# Patient Record
Sex: Male | Born: 1965 | Race: White | Hispanic: No | Marital: Married | Smoking: Never smoker
Health system: Southern US, Community
[De-identification: ages and names within clinical notes are randomized; demographics above are authoritative.]

## PROBLEM LIST (undated history)

## (undated) DIAGNOSIS — T7840XA Allergy, unspecified, initial encounter: Secondary | ICD-10-CM

## (undated) DIAGNOSIS — K219 Gastro-esophageal reflux disease without esophagitis: Secondary | ICD-10-CM

## (undated) HISTORY — DX: Allergy, unspecified, initial encounter: T78.40XA

## (undated) HISTORY — DX: Gastro-esophageal reflux disease without esophagitis: K21.9

---
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
## 2000-02-17 ENCOUNTER — Emergency Department (HOSPITAL_COMMUNITY): Admission: EM | Admit: 2000-02-17 | Discharge: 2000-02-17 | Payer: Self-pay | Admitting: Emergency Medicine

## 2000-02-25 ENCOUNTER — Emergency Department (HOSPITAL_COMMUNITY): Admission: EM | Admit: 2000-02-25 | Discharge: 2000-02-26 | Payer: Self-pay | Admitting: Emergency Medicine

## 2004-01-21 ENCOUNTER — Ambulatory Visit: Payer: Self-pay | Admitting: Internal Medicine

## 2004-03-23 ENCOUNTER — Ambulatory Visit: Payer: Self-pay

## 2005-04-29 ENCOUNTER — Ambulatory Visit: Payer: Self-pay | Admitting: Internal Medicine

## 2005-04-29 ENCOUNTER — Inpatient Hospital Stay (HOSPITAL_COMMUNITY): Admission: EM | Admit: 2005-04-29 | Discharge: 2005-04-30 | Payer: Self-pay | Admitting: Emergency Medicine

## 2005-05-05 ENCOUNTER — Ambulatory Visit: Payer: Self-pay | Admitting: Internal Medicine

## 2005-05-09 ENCOUNTER — Ambulatory Visit: Payer: Self-pay | Admitting: Cardiovascular Disease

## 2005-05-25 ENCOUNTER — Ambulatory Visit: Payer: Self-pay | Admitting: Internal Medicine

## 2005-06-14 ENCOUNTER — Ambulatory Visit: Payer: Self-pay | Admitting: Internal Medicine

## 2005-07-13 ENCOUNTER — Ambulatory Visit: Payer: Self-pay | Admitting: Internal Medicine

## 2005-07-26 ENCOUNTER — Ambulatory Visit: Payer: Self-pay | Admitting: Cardiovascular Disease

## 2005-08-02 ENCOUNTER — Ambulatory Visit: Payer: Self-pay | Admitting: Gastroenterology

## 2005-08-04 ENCOUNTER — Ambulatory Visit: Payer: Self-pay | Admitting: Internal Medicine

## 2005-08-19 ENCOUNTER — Ambulatory Visit: Payer: Self-pay

## 2005-08-23 ENCOUNTER — Ambulatory Visit: Payer: Self-pay | Admitting: Gastroenterology

## 2005-12-26 ENCOUNTER — Ambulatory Visit: Payer: Self-pay | Admitting: Internal Medicine

## 2005-12-27 ENCOUNTER — Ambulatory Visit: Payer: Self-pay | Admitting: Internal Medicine

## 2005-12-27 ENCOUNTER — Ambulatory Visit: Payer: Self-pay | Admitting: Cardiovascular Disease

## 2006-01-23 ENCOUNTER — Ambulatory Visit: Payer: Self-pay | Admitting: Internal Medicine

## 2006-12-26 DIAGNOSIS — K219 Gastro-esophageal reflux disease without esophagitis: Secondary | ICD-10-CM | POA: Insufficient documentation

## 2007-09-25 ENCOUNTER — Ambulatory Visit: Payer: Self-pay | Admitting: Internal Medicine

## 2007-09-28 ENCOUNTER — Ambulatory Visit: Payer: Self-pay | Admitting: Internal Medicine

## 2007-09-28 LAB — CONVERTED CEMR LAB
Albumin: 3.9 g/dL (ref 3.5–5.2)
Alkaline Phosphatase: 54 units/L (ref 39–117)
BUN: 15 mg/dL (ref 6–23)
Basophils Relative: 0.6 % (ref 0.0–1.0)
Bilirubin Urine: NEGATIVE
Bilirubin Urine: NEGATIVE
Calcium: 9.4 mg/dL (ref 8.4–10.5)
Creatinine, Ser: 0.9 mg/dL (ref 0.4–1.5)
Direct LDL: 117.5 mg/dL
Eosinophils Absolute: 0.2 10*3/uL (ref 0.0–0.7)
Eosinophils Relative: 3.3 % (ref 0.0–5.0)
GFR calc Af Amer: 120 mL/min
GFR calc non Af Amer: 99 mL/min
Glucose, Bld: 99 mg/dL (ref 70–99)
Glucose, Urine, Semiquant: NEGATIVE
HCT: 45.9 % (ref 39.0–52.0)
Hemoglobin: 15.9 g/dL (ref 13.0–17.0)
Ketones, urine, test strip: NEGATIVE
MCV: 89.3 fL (ref 78.0–100.0)
Monocytes Absolute: 0.6 10*3/uL (ref 0.1–1.0)
Monocytes Relative: 9.4 % (ref 3.0–12.0)
Neutro Abs: 3.9 10*3/uL (ref 1.4–7.7)
Nitrite: NEGATIVE
Platelets: 246 10*3/uL (ref 150–400)
Protein, U semiquant: NEGATIVE
Specific Gravity, Urine: 1.02
TSH: 1.06 microintl units/mL (ref 0.35–5.50)
Total CHOL/HDL Ratio: 6.7
Total Protein: 8 g/dL (ref 6.0–8.3)
Triglycerides: 207 mg/dL (ref 0–149)
Urobilinogen, UA: 0.2
WBC Urine, dipstick: NEGATIVE
WBC: 6.6 10*3/uL (ref 4.5–10.5)
pH: 5

## 2008-05-30 ENCOUNTER — Ambulatory Visit: Payer: Self-pay | Admitting: Internal Medicine

## 2008-05-30 DIAGNOSIS — J069 Acute upper respiratory infection, unspecified: Secondary | ICD-10-CM | POA: Insufficient documentation

## 2008-07-31 ENCOUNTER — Ambulatory Visit: Payer: Self-pay | Admitting: Internal Medicine

## 2008-07-31 DIAGNOSIS — J309 Allergic rhinitis, unspecified: Secondary | ICD-10-CM | POA: Insufficient documentation

## 2008-10-02 ENCOUNTER — Ambulatory Visit: Payer: Self-pay | Admitting: Internal Medicine

## 2008-10-02 DIAGNOSIS — R1031 Right lower quadrant pain: Secondary | ICD-10-CM | POA: Insufficient documentation

## 2008-10-22 ENCOUNTER — Telehealth: Payer: Self-pay | Admitting: Internal Medicine

## 2008-10-24 ENCOUNTER — Ambulatory Visit: Payer: Self-pay | Admitting: Internal Medicine

## 2008-10-24 ENCOUNTER — Encounter: Admission: RE | Admit: 2008-10-24 | Discharge: 2008-10-24 | Payer: Self-pay | Admitting: Internal Medicine

## 2008-10-24 DIAGNOSIS — Z87448 Personal history of other diseases of urinary system: Secondary | ICD-10-CM | POA: Insufficient documentation

## 2008-10-24 LAB — CONVERTED CEMR LAB
Nitrite: NEGATIVE
Specific Gravity, Urine: 1.025
WBC Urine, dipstick: NEGATIVE

## 2009-06-16 ENCOUNTER — Telehealth: Payer: Self-pay | Admitting: Internal Medicine

## 2009-06-16 ENCOUNTER — Encounter: Payer: Self-pay | Admitting: Internal Medicine

## 2009-08-25 ENCOUNTER — Telehealth: Payer: Self-pay | Admitting: Internal Medicine

## 2009-08-27 ENCOUNTER — Ambulatory Visit: Payer: Self-pay | Admitting: Internal Medicine

## 2009-08-27 DIAGNOSIS — J029 Acute pharyngitis, unspecified: Secondary | ICD-10-CM | POA: Insufficient documentation

## 2010-04-20 NOTE — Assessment & Plan Note (Signed)
Summary: sore throat/dm  And a the  Vital Signs:  Patient profile:   45 year old male Weight:      178 pounds Temp:     98.7 degrees F oral BP sitting:   110 / 74  (right arm) Cuff size:   regular  Vitals Entered By: Duard Brady LPN (August 28, 5174 3:17 PM) CC: c/o sore throat , bodyaches, chills        no n/v/d Is Patient Diabetic? No   Primary Care Provider:  Gordy Savers  MD  CC:  c/o sore throat , bodyaches, and chills        no n/v/d.  History of Present Illness: 45 year old patient with a 4-day history of sore throat, headache, body aches, intermittent chills.  A rapid strep positive.  Today.  No obvious strep exposure.  Preventive Screening-Counseling & Management  Alcohol-Tobacco     Smoking Status: never  Allergies: 1)  ! Claritin  Past History:  Past Medical History: Reviewed history from 07/31/2008 and no changes required. Sinusitis GERD IBS CP Allergic rhinitis  Review of Systems       The patient complains of anorexia, fever, muscle weakness, and headaches.    Physical Exam  General:  Well-developed,well-nourished,in no acute distress; alert,appropriate and cooperative throughout examination Head:  Normocephalic and atraumatic without obvious abnormalities. No apparent alopecia or balding. Eyes:  No corneal or conjunctival inflammation noted. EOMI. Perrla. Funduscopic exam benign, without hemorrhages, exudates or papilledema. Vision grossly normal. Mouth:  pharyngeal erythema.  pharyngeal erythema.  pharyngeal erythema.   Neck:  No deformities, masses, or tenderness noted. Lungs:  Normal respiratory effort, chest expands symmetrically. Lungs are clear to auscultation, no crackles or wheezes. Heart:  Normal rate and regular rhythm. S1 and S2 normal without gallop, murmur, click, rub or other extra sounds.   Impression & Recommendations:  Problem # 1:  SORE THROAT (ICD-462)  His updated medication list for this problem includes:  Amoxicillin 500 Mg Caps (Amoxicillin) ..... One capsule 3 times daily with meals  Orders: Rapid Strep (16073)  His updated medication list for this problem includes:    Amoxicillin 500 Mg Caps (Amoxicillin) ..... One capsule 3 times daily with meals  Problem # 2:  GERD (ICD-530.81)  Complete Medication List: 1)  Amoxicillin 500 Mg Caps (Amoxicillin) .... One capsule 3 times daily with meals  Patient Instructions: 1)  Take your antibiotic as prescribed until ALL of it is gone, but stop if you develop a rash or swelling and contact our office as soon as possible. 2)  CELEBREX  2 DAILY  Prescriptions: AMOXICILLIN 500 MG CAPS (AMOXICILLIN) one capsule 3 times daily with meals  #30 x 0   Entered and Authorized by:   Gordy Savers  MD   Signed by:   Gordy Savers  MD on 08/27/2009   Method used:   Print then Give to Patient   RxID:   [id number] AMOXICILLIN 500 MG CAPS (AMOXICILLIN) one capsule 3 times daily with meals  #30 x 0   Entered and Authorized by:   Gordy Savers  MD   Signed by:   Gordy Savers  MD on 08/27/2009   Method used:   Print then Give to Patient   RxID:   [id number] AMOXICILLIN 500 MG CAPS (AMOXICILLIN) one capsule 3 times daily with meals  #30 x 0   Entered and Authorized by:   Gordy Savers  MD   Signed by:  Gordy Savers  MD on 08/27/2009   Method used:   Electronically to        [facility] # [id number]* (retail)       [address]       [place], [place]  [address]       Ph: [phone]       Fax: [phone]   RxID:   [id number]   Laboratory Results    Other Tests  Rapid Strep: positive

## 2010-04-20 NOTE — Progress Notes (Signed)
Summary: REQ FOR DOCUMENTATION  Phone Note Call from Patient   Caller: Patient [phone] Reason for Call: Talk to Nurse, Talk to Doctor Summary of Call: Pt called to adv that he is going out of the country ([place]) and will need documentation stating that he doesn't have and has not had any types of diseases such as malaria, etc....Marland KitchenMarland KitchenPt adv that he has been a pt of Dr K for over 15-20 yrs and all of his records should be at [facility]..... Pt needs same documented on [facility] letterhead.... Pt adv that he can be reached at [phone] with any questions or concerns....Marland Kitchen or when same is ready for p/u.   Initial call taken by: Debbra Riding,  June 16, 2009 10:19 AM  Follow-up for Phone Call        Called pt and adv him that letter has been done and is ready for p/u.  Follow-up by: Debbra Riding,  June 16, 2009 11:43 AM    done-OK to print on letterhead  Appended Document: REQ FOR DOCUMENTATION attempt to call number left by pt - ans mach - LMTCB if questions - letter ready for pick up. KIK

## 2010-04-20 NOTE — Letter (Signed)
Summary: Generic Letter  [place] [facility]  [address] [place], [place] [address]   Phone: [phone]  Fax: [phone]    06/16/2009  [name] [address] [place], [place]  [address]  Dear [name]:  Mr. [name] is a patient followed in our internal medicine practice for approximately 15 years.  He takes no chronic medication and has no significant health issues.  He has no history of malaria or any communicable disease.  He has no activity restrictions and remains in excellent health. If further details are required please do not hesitate to contact this office.        Sincerely,   Eleonore Chiquito  MD

## 2010-04-20 NOTE — Progress Notes (Signed)
Summary: viral?  Phone Note Call from Patient   Summary of Call: Woke aching legs & headache & all over.  Feels hot, ?fever.  Nausea & stomach hurts.  Flu or virus.  Clear liquids, sips only until better tolerated with nausea, 24 hours.  Rest.  Prilosec or Pepcid AC is available OTC as needed.  Also asking about inhaling from chicken box.  Would usually cause respiratory symptoms, but no way to say for sure.   OV declined at this time.  Will call back as needed.   Initial call taken by: Rudy Jew, RN,  August 25, 2009 12:08 PM

## 2010-06-16 ENCOUNTER — Ambulatory Visit (INDEPENDENT_AMBULATORY_CARE_PROVIDER_SITE_OTHER): Payer: Self-pay | Admitting: Family Medicine

## 2010-06-16 ENCOUNTER — Encounter: Payer: Self-pay | Admitting: Family Medicine

## 2010-06-16 VITALS — BP 120/80 | Temp 97.8°F | Ht 67.5 in | Wt 178.0 lb

## 2010-06-16 DIAGNOSIS — J302 Other seasonal allergic rhinitis: Secondary | ICD-10-CM

## 2010-06-16 DIAGNOSIS — J309 Allergic rhinitis, unspecified: Secondary | ICD-10-CM

## 2010-06-16 NOTE — Progress Notes (Signed)
  Subjective:    Patient ID: [name], male    DOB: Jul 27, 1965, 45 y.o.   MRN: [id number]  HPI Patient seen with 2 week history of intermittent symptoms including frequent sneezing, watery itchy eyes, intermittent dry cough, and frequent clearing of throat. No fever. No dyspnea. Has taken over-the-counter Sudafed without much relief. Has not tried any antihistamines recently. Previous intolerance to Claritin. Patient is a nonsmoker. No chronic respiratory problems.   Review of Systems  Constitutional: Negative for fever and chills.  HENT: Positive for congestion, rhinorrhea, sneezing and postnasal drip. Negative for ear pain and sore throat.   Eyes: Negative for visual disturbance.  Respiratory: Positive for cough. Negative for shortness of breath.   Cardiovascular: Negative for chest pain.  Skin: Negative for rash.       Objective:   Physical Exam  Constitutional: He appears well-developed and well-nourished.  HENT:  Mouth/Throat: No oropharyngeal exudate.  Eyes: Conjunctivae are normal. Pupils are equal, round, and reactive to light. Right eye exhibits no discharge. Left eye exhibits no discharge.  Neck: Neck supple.  Cardiovascular: Normal rate, regular rhythm and normal heart sounds.   No murmur heard. Pulmonary/Chest: Effort normal and breath sounds normal. He has no wheezes. He has no rales.  Lymphadenopathy:    He has no cervical adenopathy.          Assessment & Plan:  Allergic rhinitis, seasonal. Start Allegra over-the-counter one daily and samples Nasonex 2 sprays per nostril once daily

## 2010-06-16 NOTE — Patient Instructions (Signed)
Try over the counter antihistamine such as Allegra (fexofenadine) and use Nasonex 2 sprays per nostril once daily.

## 2010-07-13 ENCOUNTER — Telehealth: Payer: Self-pay | Admitting: Internal Medicine

## 2010-07-13 NOTE — Telephone Encounter (Signed)
Notify patient that antibiotics are almost never required for viral sinusitis. Suggest nasal irrigation Afrin nasal spray not to exceed 7 days as well as Allegra-D

## 2010-07-13 NOTE — Telephone Encounter (Signed)
Called pt - informed of Dr. Vernon Prey instructions. KIK

## 2010-07-13 NOTE — Telephone Encounter (Signed)
Please advise 

## 2010-07-13 NOTE — Telephone Encounter (Signed)
Pt called and has a sinus inf. Pt is req to get an antibiotic called in to [facility] on [address]. Pt said that he can not afford ov at this time. Pls call in asap today.

## 2010-08-06 NOTE — Assessment & Plan Note (Signed)
[facility]                              CARDIOLOGY OFFICE NOTE   [name], [name]                      MRN:          [id number]  DATE:12/27/2005                            DOB:          Aug 12, 1965    Mr. [name] returns today for follow up.   He continues to have significant problems with anxiety. Dr. Amador Cunas has  prescribed Zoloft for him but he has not started it yet. His stress echo was  entirely normal. He has recently been hospitalized and ruled out for  myocardial infarction. I had a lengthy discussion with the patient regarding  his symptoms. Every time I talk to him about his chest pain he comes up with  another system that he wants explained.  He complains of sharp shooting pain  in his left hand, numbness in his left leg, problem with a deviated septum  and breathing at night, upper abdominal pain.   I tried to be quite frank with Mr. [name] and explained to him that I  thought he should take his Zoloft for an anxiety disorder.   There is no obvious evidence of a cardiac problem. He would like a CT of his  chest to rule out any other causes of his chest pain and I think since he  has been hospitalized and has a normal stress echo and continues to have  chest pain, that this is reasonable.   EXAMINATION:  VITAL SIGNS: Blood pressure today is 120/70, pulse 70 and  regular.  LUNGS:  Clear.  NECK:  Carotids normal. There is no thyromegaly, no lymphadenopathy.  CARDIOVASCULAR:  There is an S1, S2 with normal heart sounds.  ABDOMEN:  Benign.  LOWER EXTREMITIES:  Good pulses, no edema.  HEENT:  Pupils are equal, round and reactive to light and accommodation.  NEURO EXAM:  Nonfocal.   A resting EKG is normal.   IMPRESSION:  Atypical chest pain. Normal stress-echocardiogram.  Follow up chest CT to rule out non cardiac pathology in the chest.  Follow up with Dr. Amador Cunas 30 days after starting Zoloft.      ______________________________  Noralyn Pick. Eden Emms, MD, [facility]   PCN/MedQ DD:  12/27/2005 DT:  12/28/2005 Job #:  [id number]   cc:   Gordy Savers, MD

## 2010-08-06 NOTE — H&P (Signed)
NAME:  [name], [name] NO.:  [id number]   MEDICAL RECORD NO.:  [id number]          PATIENT TYPE:  EMS   LOCATION:  [facility]                         FACILITY:  [facility]   PHYSICIAN:  Corwin Levins, M.D. LHCDATE OF BIRTH:  1965/12/20   DATE OF ADMISSION:  04/29/2005  DATE OF DISCHARGE:                                HISTORY & PHYSICAL   CHIEF COMPLAINT:  Chest pain earlier today.   HISTORY OF PRESENT ILLNESS:  [name] is a 45 year old male who was  driving home after work.  He became lightheaded with some substernal chest  discomfort.  He arrived home and had some shortness of breath and  palpitations.  He drove with wife to the ER where he was treated in the  usual fashion.  Sublingual nitroglycerin was administered, but patient now  says it took up to approximately 1 hour before the discomfort seemed to go  away.  He had not thought of trying antacids and was not given any in the  ER.  He has had this off and on for at least a year.  He did see a  cardiologist in [place] last summer who felt that he was otherwise okay.  He  used to lift weights about 5 years ago.  He thinks this pain is somewhat  similar but did not have shortness of breath with the weightlifting and  discomfort of the chest.  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  He did  state that he did feel some low anterior chest wall tenderness yesterday as  well.  Of note, a stress Cardiolite was negative 12 months ago.   PAST MEDICAL HISTORY:   ILLNESSES:  None.   SURGERIES:  Status post appendectomy.   ALLERGIES:  No known drug allergies.   CURRENT MEDICATIONS:  None.   SOCIAL HISTORY:  No tobacco, no alcohol.  Married, 2 children.  Works as an  Network engineer of a Programme researcher, broadcasting/film/video in [place].   FAMILY HISTORY:  Father with heart disease.   REVIEW OF SYSTEMS:  Overall noncontributory.   PHYSICAL EXAMINATION:  GENERAL:   [name] is a 45 year old male.  VITAL SIGNS: Afebrile.  Blood pressure 139/83, heart rate 82, respiratory  rate 18, O2 saturation 99%.  HEENT:  Sclerae clear.  TMs clear.  Oropharynx benign.  NECK:  Without lymphadenopathy, JVD, thyromegaly.  CHEST: Without wheeze.  CARDIOVASCULAR:  Regular rate and rhythm.  ABDOMEN: Soft, nontender, positive bowel sounds.  EXTREMITIES:  No edema.   We are unable to reproduce his anterior chest wall discomfort on palpation  today.   LABORATORY DATA:  Hemoglobin 15.6, white blood cell count 13.1.  Electrolytes: Potassium 3.1, BUN 13, creatinine 1.3, glucose 104. CPK-MB 1,  troponin I less than 0.05.   ECG: Sinus rhythm, no acute changes.   ASSESSMENT AND PLAN:  1.  Chest discomfort, atypical, questionable musculoskeletal or      gastrointestinal versus cardiac.  I think cardiac most likely given risk      factors and atypical history.  He is to be admitted.  Will rule out      myocardial infarction with cardiac enzymes with tight telemetry.  If      negative, should consider followup with cardiologist to consider cardiac      catheterization.  Will start empiric PPI, Darvocet, and Ecotrin tonight.  2.  Hypokalemia.  Replace p.o.           ______________________________  Corwin Levins, M.D. [facility]     JWJ/MEDQ  D:  04/29/2005  T:  04/30/2005  Job:  [id number]   cc:   Gordy Savers, M.D. [facility]  [address] [place]  [place] [address]

## 2010-08-06 NOTE — Discharge Summary (Signed)
NAMESEBASTIAN, [name] NO.:  [id number]   MEDICAL RECORD NO.:  [id number]          PATIENT TYPE:  INP   LOCATION:  4735                         FACILITY:  [facility]   PHYSICIAN:  Corwin Levins, M.D. LHCDATE OF BIRTH:  04-08-1965   DATE OF ADMISSION:  04/29/2005  DATE OF DISCHARGE:  04/30/2005                                 DISCHARGE SUMMARY   DISCHARGE DIAGNOSES:  1.  Chest pain, atypical.  2.  Mild hypokalemia.   CONSULTATIONS:  None.   PROCEDURES:  None.   HISTORY OF PRESENT ILLNESS:  History and Physical was dictated on admission,  April 29, 2005.  By reports, Mr. [name] is a 45 year old man, who has  significant stress on a daily basis, and who has had intermittent chest  discomfort for the last year and a half.  There are some __________ features  as well as possible gastric GI features, but last evening was somewhat  different in that there was some increased shortness of breath and  palpitations.  The severity of pain questionably seemed to improve with  nitroglycerin.  He was admitted, placed on telemetry, and ruled out with  cardiac enzymes, which were negative.  Mild hypokalemia was addressed with  p.o. potassium supplement.  He was also placed on empiric __________ and  given Darvocet as well as Ecotrin.  He did well overnight without further  symptoms and was ambulatory as well without further symptoms.  Data was  essentially negative as above.  He was felt to have gained maximum benefit  from hospitalization and is to be discharged home.  Of note is that he did a  stress Cardiolite negative approximately 12 to 18 months ago, but he is not  sure of the exact date.   DISPOSITION:  Discharged to home in good condition.  No activity or dietary  restrictions except no heavy lifting or a new exercise program for the time  being.   DISCHARGE MEDICATIONS:  1.  Ecotrin 325 mg p.o. daily.  2.  Protonix 40 mg p.o. daily.  3.  Darvocet on a p.r.n.  basis.   FOLLOWUP:  He will follow up with his primary physician, Dr. Amador Hayes, in  the next week, and I suggested, as he is very interested in a local  Cardiology consultation, that he see Dr. Myrtis Hayes on referral from Dr.  Amador Hayes.  __________           ______________________________  Corwin Levins, M.D. [facility]     JWJ/MEDQ  D:  04/30/2005  T:  05/01/2005  Job:  [id number]

## 2011-05-21 IMAGING — US US ABDOMEN COMPLETE
1 series · 14 of 25 positions shown · non-contrast
Comparison: None

CLINICAL DATA: Right upper quadrant  abdominal pain

COMPLETE ABDOMINAL ULTRASOUND

[Series 1: us abdomen complete · 0.27mm/px · 14 of 84 slices shown]
[im 1/84]
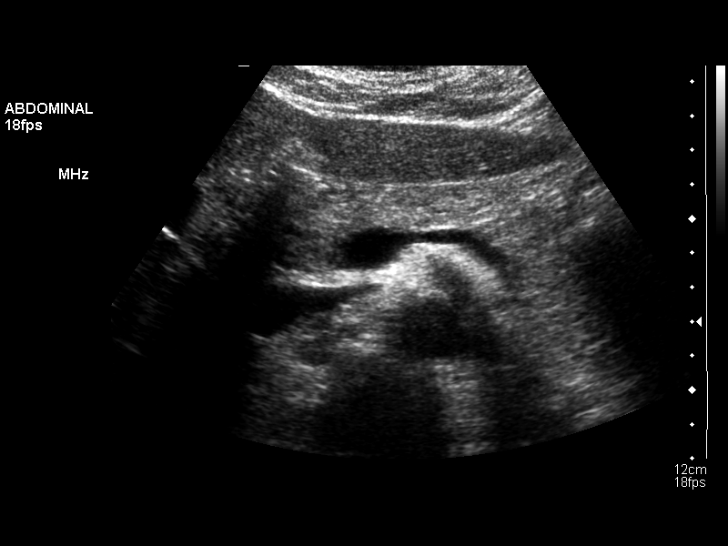
[im 7/84]
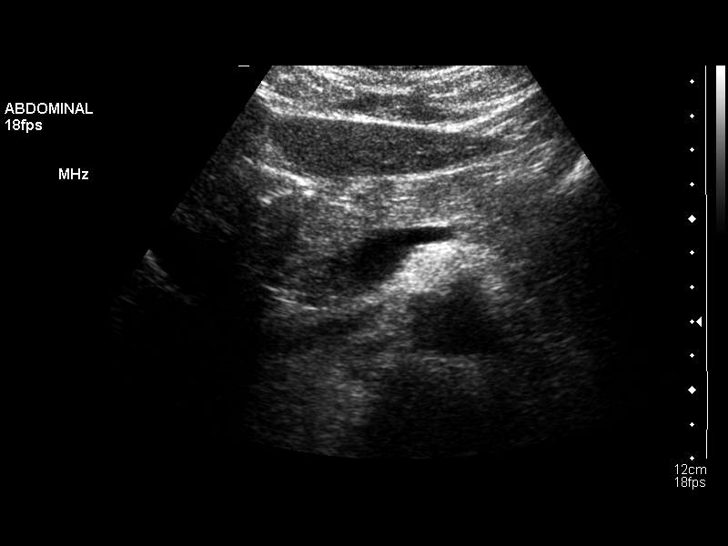
[im 14/84]
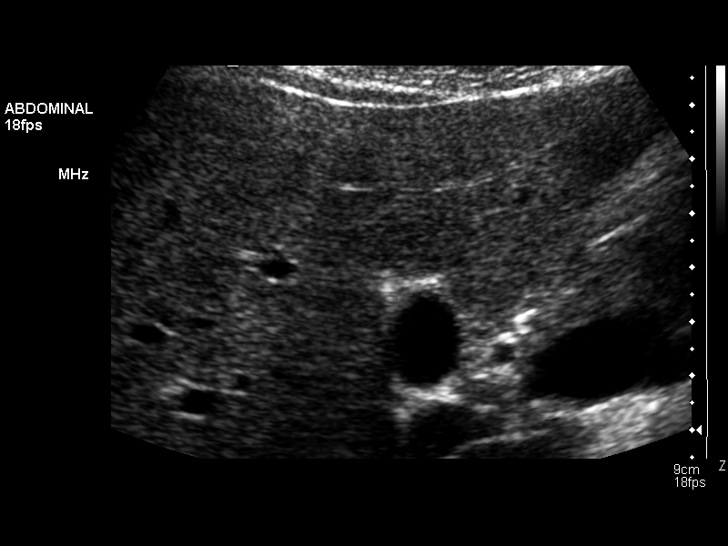
[im 21/84]
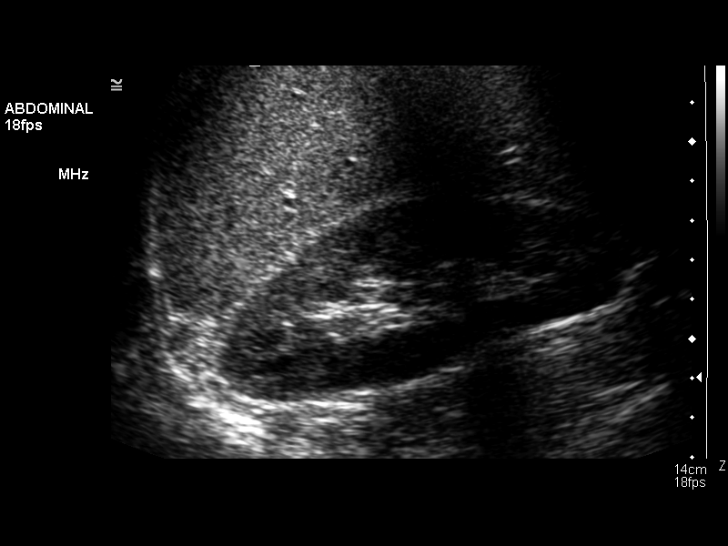
[im 28/84]
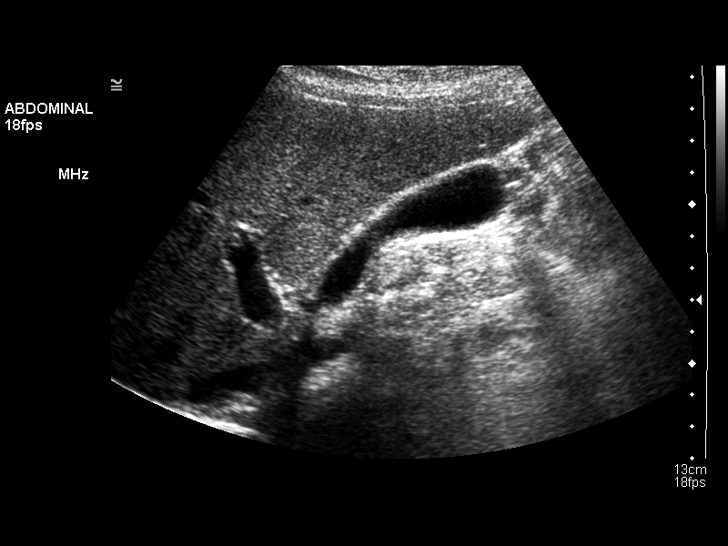
[im 32/84]
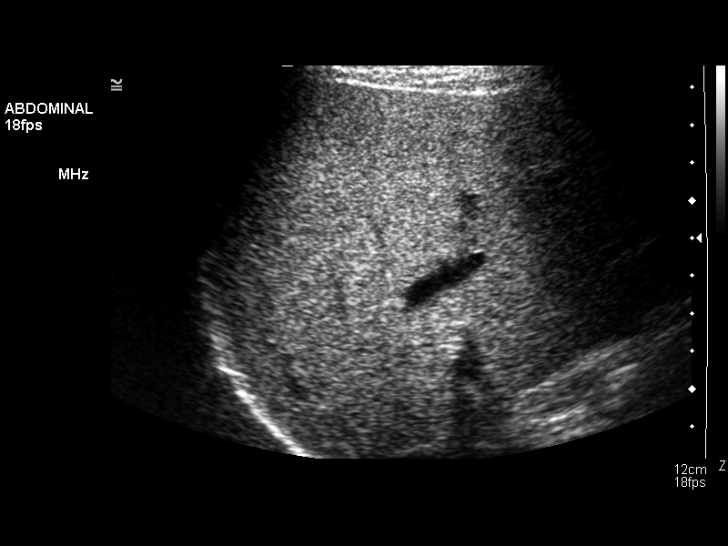
[im 39/84]
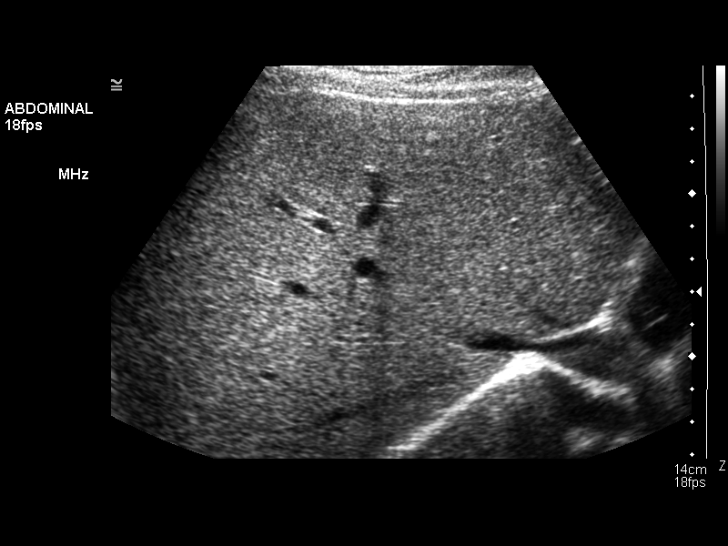
[im 45/84]
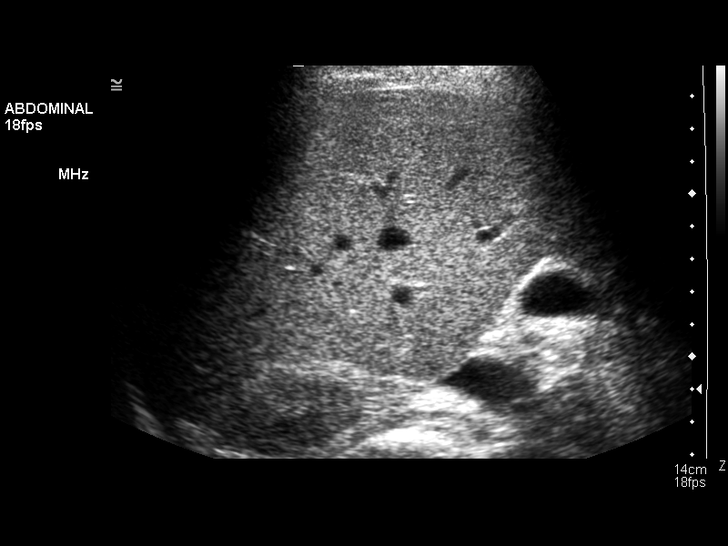
[im 52/84]
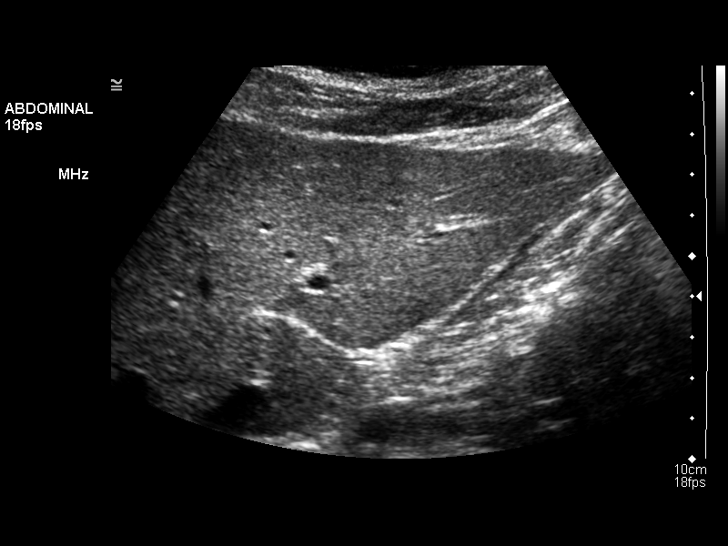
[im 56/84]
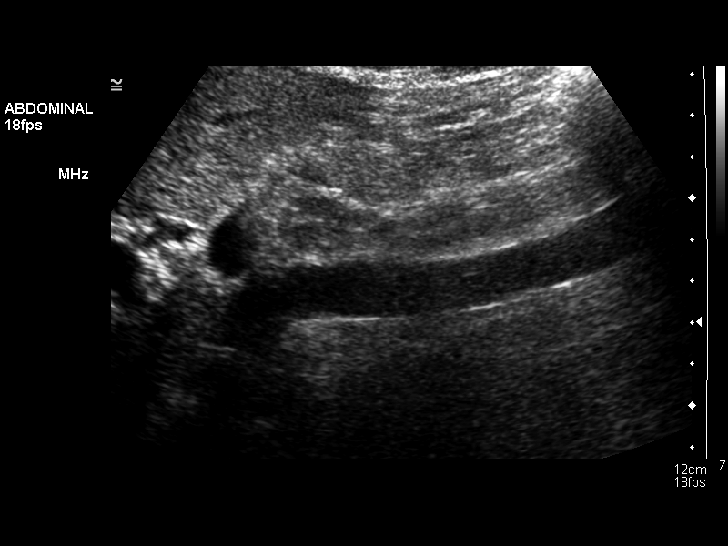
[im 63/84]
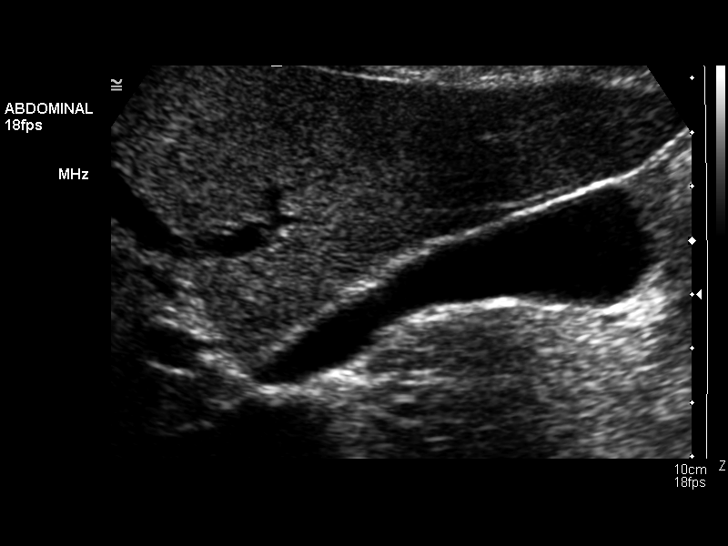
[im 70/84]
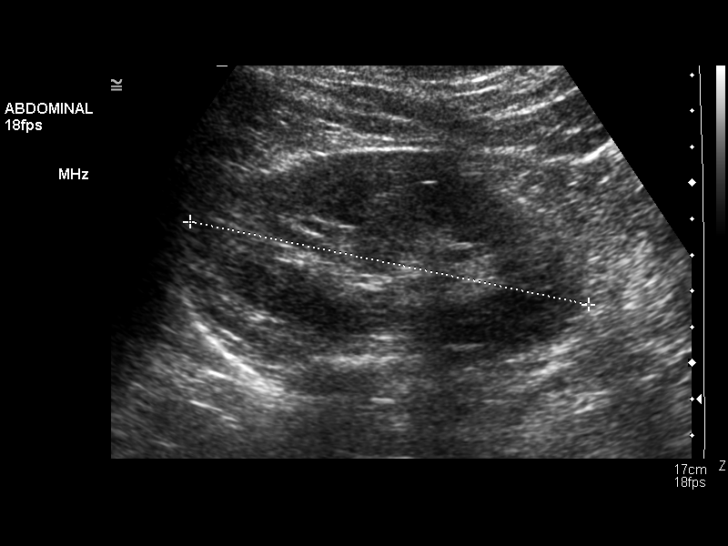
[im 77/84]
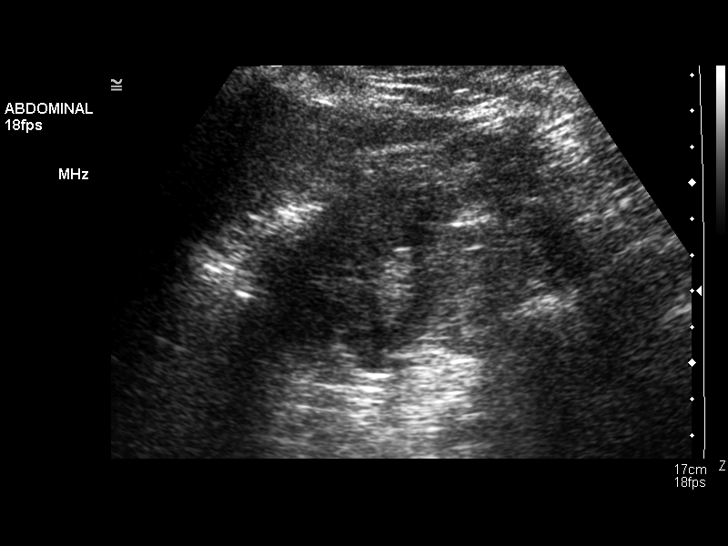
[im 84/84]
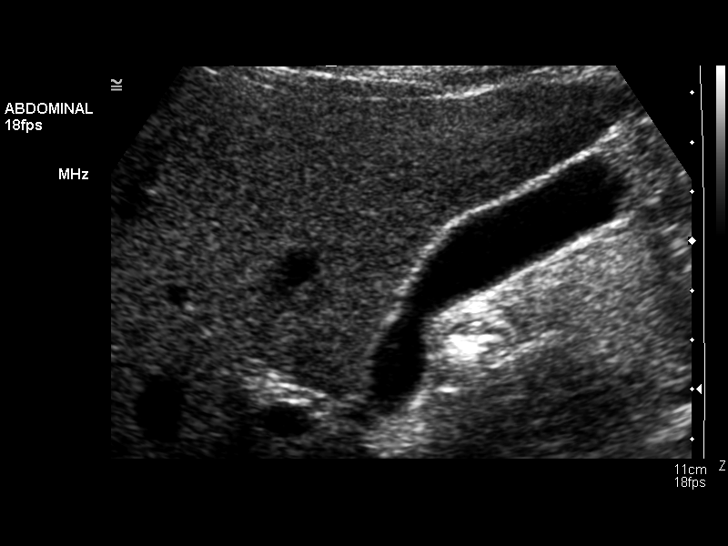

[14 of 25 positions shown; findings below may reference images not displayed]

FINDINGS: Gallbladder:  The gallbladder is well seen and no gallstones are
noted.  There is no pain over the gallbladder upon compression.

Common bile duct:  The common bile duct is normal measuring 2.6 mm
in diameter proximally.

Liver:  The liver is echogenic consistent with fatty infiltration.
No focal abnormality is seen.

IVC:  Visualized.

Pancreas:  The pancreas is well seen and appears normal.  The
pancreatic duct is not dilated.

Spleen:  The spleen appears normal.

Right Kidney:  No hydronephrosis is seen.  The right kidney
measures 11.1 cm sagittally.

Left Kidney:  No hydronephrosis.  The left kidney measures 11.4 cm.

Abdominal aorta:  The aorta is normal in caliber although distally
is obscured by bowel gas.
IMPRESSION: 1.  Fatty infiltration of the liver.  No focal abnormality.
2.  No gallstones.

## 2012-04-23 ENCOUNTER — Ambulatory Visit (INDEPENDENT_AMBULATORY_CARE_PROVIDER_SITE_OTHER): Payer: BC Managed Care – PPO | Admitting: Family Medicine

## 2012-04-23 ENCOUNTER — Encounter: Payer: Self-pay | Admitting: Family Medicine

## 2012-04-23 VITALS — BP 140/90 | Temp 98.9°F | Wt 187.0 lb

## 2012-04-23 DIAGNOSIS — J069 Acute upper respiratory infection, unspecified: Secondary | ICD-10-CM

## 2012-04-23 MED ORDER — HYDROCODONE-HOMATROPINE 5-1.5 MG/5ML PO SYRP: 5.0000 mL | ORAL_SOLUTION | Freq: Three times a day (TID) | ORAL | Status: DC | PRN

## 2012-04-23 NOTE — Patient Instructions (Signed)
Drink lots of water  2 Tylenol 3 times daily when necessary  And vaporizer in her bedroom at night  Hydromet,,,,,,,,,,, 1/2-1 teaspoon 3 times daily. For cough and cold

## 2012-04-23 NOTE — Progress Notes (Signed)
  Subjective:    Patient ID: [name], male    DOB: 30-Jan-1966, 47 y.o.   MRN: [id number]  HPI [name] is a 47 year old married male nonsmoker who comes in with a five-day history of a cough  He states his 62-year-old daughter brought the virus home from school the 2 other children have had it and that he and his wife have developed a cough     Review of Systems    review of systems negative no fever no history of pulmonary disease he is a nonsmoker Objective:   Physical Exam  Well-developed well-nourished male in no acute distress HEENT negative neck was supple no adenopathy lungs are clear      Assessment & Plan:  Viral syndrome plan treat symptomatically

## 2013-01-21 ENCOUNTER — Ambulatory Visit (INDEPENDENT_AMBULATORY_CARE_PROVIDER_SITE_OTHER): Payer: BC Managed Care – PPO | Admitting: *Deleted

## 2013-01-21 DIAGNOSIS — Z23 Encounter for immunization: Secondary | ICD-10-CM

## 2013-12-10 ENCOUNTER — Ambulatory Visit (INDEPENDENT_AMBULATORY_CARE_PROVIDER_SITE_OTHER): Payer: BC Managed Care – PPO | Admitting: Internal Medicine

## 2013-12-10 ENCOUNTER — Encounter: Payer: Self-pay | Admitting: Internal Medicine

## 2013-12-10 VITALS — BP 124/90 | HR 74 | Temp 98.0°F | Resp 20 | Ht 67.5 in | Wt 190.0 lb

## 2013-12-10 DIAGNOSIS — R1011 Right upper quadrant pain: Secondary | ICD-10-CM

## 2013-12-10 DIAGNOSIS — G8929 Other chronic pain: Secondary | ICD-10-CM

## 2013-12-10 DIAGNOSIS — R1031 Right lower quadrant pain: Secondary | ICD-10-CM

## 2013-12-10 DIAGNOSIS — Z0000 Encounter for general adult medical examination without abnormal findings: Secondary | ICD-10-CM

## 2013-12-10 LAB — COMPREHENSIVE METABOLIC PANEL
ALBUMIN: 4.2 g/dL (ref 3.5–5.2)
ALK PHOS: 58 U/L (ref 39–117)
ALT: 32 U/L (ref 0–53)
AST: 19 U/L (ref 0–37)
BUN: 20 mg/dL (ref 6–23)
CO2: 30 mEq/L (ref 19–32)
Calcium: 9.3 mg/dL (ref 8.4–10.5)
Chloride: 102 mEq/L (ref 96–112)
Creatinine, Ser: 1.1 mg/dL (ref 0.4–1.5)
GFR: 73.7 mL/min (ref >60.00)
Glucose, Bld: 92 mg/dL (ref 70–99)
POTASSIUM: 3.9 meq/L (ref 3.5–5.1)
SODIUM: 137 meq/L (ref 135–145)
TOTAL PROTEIN: 8.2 g/dL (ref 6.0–8.3)
Total Bilirubin: 0.8 mg/dL (ref 0.2–1.2)

## 2013-12-10 LAB — SEDIMENTATION RATE: SED RATE: 13 mm/h (ref 0–22)

## 2013-12-10 LAB — LIPID PANEL
CHOL/HDL RATIO: 6
Cholesterol: 189 mg/dL (ref 0–200)
HDL: 34 mg/dL — ABNORMAL LOW (ref >39.00)
LDL CALC: 129 mg/dL — AB (ref 0–99)
NonHDL: 155
Triglycerides: 132 mg/dL (ref 0.0–149.0)
VLDL: 26.4 mg/dL (ref 0.0–40.0)

## 2013-12-10 LAB — CBC WITH DIFFERENTIAL/PLATELET
BASOS PCT: 0.4 % (ref 0.0–3.0)
Basophils Absolute: 0 10*3/uL (ref 0.0–0.1)
EOS ABS: 0.2 10*3/uL (ref 0.0–0.7)
Eosinophils Relative: 2.8 % (ref 0.0–5.0)
HCT: 46.9 % (ref 39.0–52.0)
Hemoglobin: 16 g/dL (ref 13.0–17.0)
Lymphocytes Relative: 30.8 % (ref 12.0–46.0)
Lymphs Abs: 2.1 10*3/uL (ref 0.7–4.0)
MCHC: 34 g/dL (ref 30.0–36.0)
MCV: 88 fl (ref 78.0–100.0)
MONO ABS: 0.6 10*3/uL (ref 0.1–1.0)
Monocytes Relative: 9.3 % (ref 3.0–12.0)
NEUTROS PCT: 56.7 % (ref 43.0–77.0)
Neutro Abs: 3.9 10*3/uL (ref 1.4–7.7)
PLATELETS: 262 10*3/uL (ref 150.0–400.0)
RBC: 5.33 Mil/uL (ref 4.22–5.81)
RDW: 13.5 % (ref 11.5–15.5)
WBC: 7 10*3/uL (ref 4.0–10.5)

## 2013-12-10 MED ORDER — HYOSCYAMINE SULFATE 0.125 MG SL SUBL: 0.1250 mg | SUBLINGUAL_TABLET | SUBLINGUAL | Status: DC | PRN

## 2013-12-10 NOTE — Progress Notes (Signed)
Subjective:    Patient ID: [name], male    DOB: 1965-06-11, 48 y.o.   MRN: [id number]  HPI Wt Readings from Last 3 Encounters:  12/10/13 190 lb (86.183 kg)  04/23/12 187 lb (84.823 kg)  06/16/10 178 lb (80.38 kg)   48 year old patient who has a long history of intermittent right upper quadrant pain.  He did have an abdominal ultrasound performed in 2010 and revealed fatty liver changes only. He also describes some bloating.  When pain is present.  It to be aggravated by meals and also by performing activities such as sit ups.  He states that he has about a single flare per week. No weight loss, or constitutional complaints.  He also has a history of GERD and has been treatment with PPI therapy.  In the past  He has had considerable situational stress.  He will be relocating to [place] and leasing his house locally  History reviewed. No pertinent past medical history.  History   Social History  . Marital Status: Married    Spouse Name: N/A    Number of Children: N/A  . Years of Education: N/A   Occupational History  . Not on file.   Social History Main Topics  . Smoking status: Never Smoker   . Smokeless tobacco: Not on file  . Alcohol Use: Not on file  . Drug Use: Not on file  . Sexual Activity: Not on file   Other Topics Concern  . Not on file   Social History Narrative  . No narrative on file    History reviewed. No pertinent past surgical history.  No family history on file.  Allergies  Allergen Reactions  . Loratadine     No current outpatient prescriptions on file prior to visit.   No current facility-administered medications on file prior to visit.    BP 124/90  Pulse 74  Temp(Src) 98 F (36.7 C) (Oral)  Resp 20  Ht 5' 7.5" (1.715 m)  Wt 190 lb (86.183 kg)  BMI 29.30 kg/m2  SpO2 98%     Review of Systems  Constitutional: Negative for fever, chills, appetite change and fatigue.  HENT: Negative for congestion, dental problem, ear  pain, hearing loss, sore throat, tinnitus, trouble swallowing and voice change.   Eyes: Negative for pain, discharge and visual disturbance.  Respiratory: Negative for cough, chest tightness, wheezing and stridor.   Cardiovascular: Negative for chest pain, palpitations and leg swelling.  Gastrointestinal: Negative for nausea, vomiting, abdominal pain, diarrhea, constipation, blood in stool and abdominal distention.  Genitourinary: Negative for urgency, hematuria, flank pain, discharge, difficulty urinating and genital sores.  Musculoskeletal: Negative for arthralgias, back pain, gait problem, joint swelling, myalgias and neck stiffness.  Skin: Negative for rash.  Neurological: Negative for dizziness, syncope, speech difficulty, weakness, numbness and headaches.  Hematological: Negative for adenopathy. Does not bruise/bleed easily.  Psychiatric/Behavioral: Negative for behavioral problems and dysphoric mood. The patient is not nervous/anxious.        Objective:   Physical Exam  Constitutional: He is oriented to person, place, and time. He appears well-developed.  HENT:  Head: Normocephalic.  Right Ear: External ear normal.  Left Ear: External ear normal.  Eyes: Conjunctivae and EOM are normal.  Neck: Normal range of motion.  Cardiovascular: Normal rate and normal heart sounds.   Pulmonary/Chest: Breath sounds normal.  Abdominal: Soft. Bowel sounds are normal. He exhibits no distension and no mass. There is no tenderness. There is no rebound and  no guarding.  Musculoskeletal: Normal range of motion. He exhibits no edema and no tenderness.  Neurological: He is alert and oriented to person, place, and time.  Psychiatric: He has a normal mood and affect. His behavior is normal.          Assessment & Plan:   Intermittent right upper quadrant pain.  This has been chronic for at least 5 years.  He is pain-free now and his exam is unremarkable.  Options discussed.  We'll treat with a  aggressive antireflux regimen.  Will place on anti-spasmodic seizures suspected IBS.  Dietary information and general information about IBS. dispensed.  Will check screening lab per patient request

## 2013-12-10 NOTE — Patient Instructions (Signed)
Diet and Irritable Bowel Syndrome  No cure has been found for irritable bowel syndrome (IBS). Many options are available to treat the symptoms. Your caregiver will give you the best treatments available for your symptoms. He or she will also encourage you to manage stress and to make changes to your diet. You need to work with your caregiver and Registered Dietician to find the best combination of medicine, diet, counseling, and support to control your symptoms. The following are some diet suggestions. FOODS THAT MAKE IBS WORSE  Fatty foods, such as French fries.  Milk products, such as cheese or ice cream.  Chocolate.  Alcohol.  Caffeine (found in coffee and some sodas).  Carbonated drinks, such as soda. If certain foods cause symptoms, you should eat less of them or stop eating them. FOOD JOURNAL   Keep a journal of the foods that seem to cause distress. Write down:  What you are eating during the day and when.  What problems you are having after eating.  When the symptoms occur in relation to your meals.  What foods always make you feel badly.  Take your notes with you to your caregiver to see if you should stop eating certain foods. FOODS THAT MAKE IBS BETTER Fiber reduces IBS symptoms, especially constipation, because it makes stools soft, bulky, and easier to pass. Fiber is found in bran, bread, cereal, beans, fruit, and vegetables. Examples of foods with fiber include:  Apples.  Peaches.  Pears.  Berries.  Figs.  Broccoli, raw.  Cabbage.  Carrots.  Raw peas.  Kidney beans.  Lima beans.  Whole-grain bread.  Whole-grain cereal. Add foods with fiber to your diet a little at a time. This will let your body get used to them. Too much fiber at once might cause gas and swelling of your abdomen. This can trigger symptoms in a person with IBS. Caregivers usually recommend a diet with enough fiber to produce soft, painless bowel movements. High fiber diets may  cause gas and bloating. However, these symptoms often go away within a few weeks, as your body adjusts. In many cases, dietary fiber may lessen IBS symptoms, particularly constipation. However, it may not help pain or diarrhea. High fiber diets keep the colon mildly enlarged (distended) with the added fiber. This may help prevent spasms in the colon. Some forms of fiber also keep water in the stool, thereby preventing hard stools that are difficult to pass.  Besides telling you to eat more foods with fiber, your caregiver may also tell you to get more fiber by taking a fiber pill or drinking water mixed with a special high fiber powder. An example of this is a natural fiber laxative containing psyllium seed.  TIPS  Large meals can cause cramping and diarrhea in people with IBS. If this happens to you, try eating 4 or 5 small meals a day, or try eating less at each of your usual 3 meals. It may also help if your meals are low in fat and high in carbohydrates. Examples of carbohydrates are pasta, rice, whole-grain breads and cereals, fruits, and vegetables.  If dairy products cause your symptoms to flare up, you can try eating less of those foods. You might be able to handle yogurt better than other dairy products, because it contains bacteria that helps with digestion. Dairy products are an important source of calcium and other nutrients. If you need to avoid dairy products, be sure to talk with a Registered Dietitian about getting these nutrients   through other food sources.  Drink enough water and fluids to keep your urine clear or pale yellow. This is important, especially if you have diarrhea. FOR MORE INFORMATION  International Foundation for Functional Gastrointestinal Disorders: www.iffgd.org  National Digestive Diseases Information Clearinghouse: digestive.niddk.nih.gov Document Released: 05/28/2003 Document Revised: 05/30/2011 Document Reviewed: 06/07/2013 ExitCare Patient Information 2015  [employer]. This information is not intended to replace advice given to you by your health care provider. Make sure you discuss any questions you have with your health care provider. Irritable Bowel Syndrome Irritable bowel syndrome (IBS) is caused by a disturbance of normal bowel function and is a common digestive disorder. You may also hear this condition called spastic colon, mucous colitis, and irritable colon. There is no cure for IBS. However, symptoms often gradually improve or disappear with a good diet, stress management, and medicine. This condition usually appears in late adolescence or early adulthood. Women develop it twice as often as men. CAUSES  After food has been digested and absorbed in the small intestine, waste material is moved into the large intestine, or colon. In the colon, water and salts are absorbed from the undigested products coming from the small intestine. The remaining residue, or fecal material, is held for elimination. Under normal circumstances, gentle, rhythmic contractions of the bowel walls push the fecal material along the colon toward the rectum. In IBS, however, these contractions are irregular and poorly coordinated. The fecal material is either retained too long, resulting in constipation, or expelled too soon, producing diarrhea. SIGNS AND SYMPTOMS  The most common symptom of IBS is abdominal pain. It is often in the lower left side of the abdomen, but it may occur anywhere in the abdomen. The pain comes from spasms of the bowel muscles happening too much and from the buildup of gas and fecal material in the colon. This pain:  Can range from sharp abdominal cramps to a dull, continuous ache.  Often worsens soon after eating.  Is often relieved by having a bowel movement or passing gas. Abdominal pain is usually accompanied by constipation, but it may also produce diarrhea. The diarrhea often occurs right after a meal or upon waking up in the morning. The  stools are often soft, watery, and flecked with mucus. Other symptoms of IBS include:  Bloating.  Loss of appetite.  Heartburn.  Backache.  Dull pain in the arms or shoulders.  Nausea.  Burping.  Vomiting.  Gas. IBS may also cause symptoms that are unrelated to the digestive system, such as:  Fatigue.  Headaches.  Anxiety.  Shortness of breath.  Trouble concentrating.  Dizziness. These symptoms tend to come and go. DIAGNOSIS  The symptoms of IBS may seem like symptoms of other, more serious digestive disorders. Your health care provider may want to perform tests to exclude these disorders.  TREATMENT Many medicines are available to help correct bowel function or relieve bowel spasms and abdominal pain. Among the medicines available are:  Laxatives for severe constipation and to help restore normal bowel habits.  Specific antidiarrheal medicines to treat severe or lasting diarrhea.  Antispasmodic agents to relieve intestinal cramps. Your health care provider may also decide to treat you with a mild tranquilizer or sedative during unusually stressful periods in your life. Your health care provider may also prescribe antidepressant medicine. The use of this medicine has been shown to reduce pain and other symptoms of IBS. Remember that if any medicine is prescribed for you, you should take it exactly as directed.   Make sure your health care provider knows how well it worked for you. HOME CARE INSTRUCTIONS   Take all medicines as directed by your health care provider.  Avoid foods that are high in fat or oils, such as heavy cream, butter, frankfurters, sausage, and other fatty meats.  Avoid foods that make you go to the bathroom, such as fruit, fruit juice, and dairy products.  Cut out carbonated drinks, chewing gum, and "gassy" foods such as beans and cabbage. This may help relieve bloating and burping.  Eat foods with bran, and drink plenty of liquids with the bran  foods. This helps relieve constipation.  Keep track of what foods seem to bring on your symptoms.  Avoid emotionally charged situations or circumstances that produce anxiety.  Start or continue exercising.  Get plenty of rest and sleep. Document Released: 03/07/2005 Document Revised: 03/12/2013 Document Reviewed: 10/26/2007 ExitCare Patient Information 2015 [employer]. This information is not intended to replace advice given to you by your health care provider. Make sure you discuss any questions you have with your health care provider.  

## 2013-12-10 NOTE — Progress Notes (Signed)
Pre visit review using our clinic review tool, if applicable. No additional management support is needed unless otherwise documented below in the visit note. 

## 2014-01-07 ENCOUNTER — Telehealth: Payer: Self-pay

## 2014-01-07 ENCOUNTER — Ambulatory Visit (INDEPENDENT_AMBULATORY_CARE_PROVIDER_SITE_OTHER): Payer: BC Managed Care – PPO

## 2014-01-07 DIAGNOSIS — Z23 Encounter for immunization: Secondary | ICD-10-CM

## 2014-01-07 NOTE — Telephone Encounter (Signed)
Pt came into office for a flu shot and states he reviewed his blood work online and all the labs were within range.  Pt states he is still experiencing intermittent joint pain in his hands and arms.  Pt states he would like to know the next step in the process.  If pt needs to see a specialist he is willing to.  [place] leaves going [place] on next 10[place]27[place]2015. Pls advise.

## 2014-01-07 NOTE — Telephone Encounter (Signed)
Attempted to call patient to further discuss his new symptoms.  These weren't addressed at his lost office visit, when his complaints were GI related.  This phone has apparently been disconnected.

## 2014-01-07 NOTE — Telephone Encounter (Signed)
Please see message and advise 

## 2014-01-08 NOTE — Telephone Encounter (Signed)
Dr. Raliegh Ip, there is no other number. I tried emergency contact in chart but he said he has not spoken to pt in years. Will have to wait for pt to call back.

## 2014-01-08 NOTE — Telephone Encounter (Signed)
Please obtain phone number if available to discuss

## 2014-01-09 NOTE — Telephone Encounter (Signed)
Dr.K, here is the new phone number for patient [phone].

## 2014-01-09 NOTE — Telephone Encounter (Signed)
Noted  

## 2014-01-09 NOTE — Telephone Encounter (Signed)
Called and discussed

## 2015-05-06 ENCOUNTER — Encounter: Payer: Self-pay | Admitting: Internal Medicine

## 2015-05-06 ENCOUNTER — Ambulatory Visit (INDEPENDENT_AMBULATORY_CARE_PROVIDER_SITE_OTHER): Payer: 59 | Admitting: Internal Medicine

## 2015-05-06 VITALS — BP 136/80 | HR 68 | Temp 99.1°F | Resp 20 | Ht 67.5 in | Wt 190.0 lb

## 2015-05-06 DIAGNOSIS — K219 Gastro-esophageal reflux disease without esophagitis: Secondary | ICD-10-CM

## 2015-05-06 DIAGNOSIS — R1010 Upper abdominal pain, unspecified: Secondary | ICD-10-CM

## 2015-05-06 DIAGNOSIS — G8929 Other chronic pain: Secondary | ICD-10-CM

## 2015-05-06 DIAGNOSIS — R1011 Right upper quadrant pain: Secondary | ICD-10-CM

## 2015-05-06 MED ORDER — HYOSCYAMINE SULFATE 0.125 MG SL SUBL: 0.1250 mg | SUBLINGUAL_TABLET | SUBLINGUAL | Status: DC | PRN

## 2015-05-06 MED ORDER — PANTOPRAZOLE SODIUM 40 MG PO TBEC: 40.0000 mg | DELAYED_RELEASE_TABLET | Freq: Every day | ORAL | Status: DC

## 2015-05-06 NOTE — Progress Notes (Signed)
Pre visit review using our clinic review tool, if applicable. No additional management support is needed unless otherwise documented below in the visit note. 

## 2015-05-06 NOTE — Progress Notes (Signed)
Subjective:    Patient ID: [name], male    DOB: May 19, 1965, 50 y.o.   MRN: [id number]  HPI  50 year old patient who has a seven-year history of intermittent right upper quadrant and epigastric pain.  For the past 2 days, he has resumed  PPI therapy but has not been on this for a number of months.  He describes the pain as a 1 or 2 on a scale of 10.  Associated symptoms include bloating.  He has had inconsistent relief from anti-spasmodic medications.  He does feel stress plays a role A sister is status post cholecystectomy  No past medical history on file.  Social History   Social History  . Marital Status: Married    Spouse Name: N/A  . Number of Children: N/A  . Years of Education: N/A   Occupational History  . Not on file.   Social History Main Topics  . Smoking status: Never Smoker   . Smokeless tobacco: Not on file  . Alcohol Use: Not on file  . Drug Use: Not on file  . Sexual Activity: Not on file   Other Topics Concern  . Not on file   Social History Narrative    No past surgical history on file.  No family history on file.  Allergies  Allergen Reactions  . Loratadine     Current Outpatient Prescriptions on File Prior to Visit  Medication Sig Dispense Refill  . Multiple Vitamin (MULTIVITAMIN) tablet Take 1 tablet by mouth daily.    . hyoscyamine (LEVSIN SL) 0.125 MG SL tablet Place 1 tablet (0.125 mg total) under the tongue every 4 (four) hours as needed. (Patient not taking: Reported on 05/06/2015) 30 tablet 4   No current facility-administered medications on file prior to visit.    BP 136/80 mmHg  Pulse 68  Temp(Src) 99.1 F (37.3 C) (Oral)  Resp 20  Ht 5' 7.5" (1.715 m)  Wt 190 lb (86.183 kg)  BMI 29.30 kg/m2  SpO2 98%     Review of Systems  Constitutional: Negative for fever, chills, appetite change and fatigue.  HENT: Negative for congestion, dental problem, ear pain, hearing loss, sore throat, tinnitus, trouble swallowing and  voice change.   Eyes: Negative for pain, discharge and visual disturbance.  Respiratory: Negative for cough, chest tightness, wheezing and stridor.   Cardiovascular: Negative for chest pain, palpitations and leg swelling.  Gastrointestinal: Positive for abdominal pain. Negative for nausea, vomiting, diarrhea, constipation, blood in stool and abdominal distention.  Genitourinary: Negative for urgency, hematuria, flank pain, discharge, difficulty urinating and genital sores.  Musculoskeletal: Negative for myalgias, back pain, joint swelling, arthralgias, gait problem and neck stiffness.  Skin: Negative for rash.  Neurological: Negative for dizziness, syncope, speech difficulty, weakness, numbness and headaches.  Hematological: Negative for adenopathy. Does not bruise/bleed easily.  Psychiatric/Behavioral: Negative for behavioral problems and dysphoric mood. The patient is not nervous/anxious.        Objective:   Physical Exam  Constitutional: He is oriented to person, place, and time. He appears well-developed.  HENT:  Head: Normocephalic.  Right Ear: External ear normal.  Left Ear: External ear normal.  Eyes: Conjunctivae and EOM are normal.  Neck: Normal range of motion.  Cardiovascular: Normal rate and normal heart sounds.   Pulmonary/Chest: Breath sounds normal.  Abdominal: Soft. Bowel sounds are normal. He exhibits no distension and no mass. There is no tenderness. There is no rebound and no guarding.  Mildly overweight.  No tenderness  Musculoskeletal: Normal range of motion. He exhibits no edema or tenderness.  Neurological: He is alert and oriented to person, place, and time.  Psychiatric: He has a normal mood and affect. His behavior is normal.          Assessment & Plan:   Intermittent chronic right upper quadrant and epigastric pain. Patient has been treated for IBS in the past.  Symptoms seem minor and nonprogressive.  Patient wishes to consider a GI  evaluation  Medications refilled

## 2015-05-06 NOTE — Patient Instructions (Signed)
Food Choices for Gastroesophageal Reflux Disease, Adult When you have gastroesophageal reflux disease (GERD), the foods you eat and your eating habits are very important. Choosing the right foods can help ease the discomfort of GERD. WHAT GENERAL GUIDELINES DO I NEED TO FOLLOW?  Choose fruits, vegetables, whole grains, low-fat dairy products, and low-fat meat, fish, and poultry.  Limit fats such as oils, salad dressings, butter, nuts, and avocado.  Keep a food diary to identify foods that cause symptoms.  Avoid foods that cause reflux. These may be different for different people.  Eat frequent small meals instead of three large meals each day.  Eat your meals slowly, in a relaxed setting.  Limit fried foods.  Cook foods using methods other than frying.  Avoid drinking alcohol.  Avoid drinking large amounts of liquids with your meals.  Avoid bending over or lying down until 2-3 hours after eating. WHAT FOODS ARE NOT RECOMMENDED? The following are some foods and drinks that may worsen your symptoms: Vegetables Tomatoes. Tomato juice. Tomato and spaghetti sauce. Chili peppers. Onion and garlic. Horseradish. Fruits Oranges, grapefruit, and lemon (fruit and juice). Meats High-fat meats, fish, and poultry. This includes hot dogs, ribs, ham, sausage, salami, and bacon. Dairy Whole milk and chocolate milk. Sour cream. Cream. Butter. Ice cream. Cream cheese.  Beverages Coffee and tea, with or without caffeine. Carbonated beverages or energy drinks. Condiments Hot sauce. Barbecue sauce.  Sweets/Desserts Chocolate and cocoa. Donuts. Peppermint and spearmint. Fats and Oils High-fat foods, including French fries and potato chips. Other Vinegar. Strong spices, such as black pepper, white pepper, red pepper, cayenne, curry powder, cloves, ginger, and chili powder. The items listed above may not be a complete list of foods and beverages to avoid. Contact your dietitian for more  information.   This information is not intended to replace advice given to you by your health care provider. Make sure you discuss any questions you have with your health care provider.   Document Released: 03/07/2005 Document Revised: 03/28/2014 Document Reviewed: 01/09/2013 Elsevier Interactive Patient Education 2016 Elsevier Inc.  

## 2015-07-09 ENCOUNTER — Other Ambulatory Visit: Payer: Self-pay | Admitting: Internal Medicine

## 2015-08-31 ENCOUNTER — Other Ambulatory Visit: Payer: Self-pay | Admitting: Internal Medicine

## 2015-08-31 NOTE — Telephone Encounter (Signed)
Refill sent to pharmacy.   

## 2016-02-02 ENCOUNTER — Ambulatory Visit (INDEPENDENT_AMBULATORY_CARE_PROVIDER_SITE_OTHER): Payer: 59 | Admitting: Family Medicine

## 2016-02-02 VITALS — BP 104/84 | HR 70 | Temp 98.2°F | Resp 16 | Ht 67.5 in | Wt 187.4 lb

## 2016-02-02 DIAGNOSIS — Z0000 Encounter for general adult medical examination without abnormal findings: Secondary | ICD-10-CM

## 2016-02-02 DIAGNOSIS — Z113 Encounter for screening for infections with a predominantly sexual mode of transmission: Secondary | ICD-10-CM | POA: Diagnosis not present

## 2016-02-02 DIAGNOSIS — Z1329 Encounter for screening for other suspected endocrine disorder: Secondary | ICD-10-CM

## 2016-02-02 DIAGNOSIS — Z125 Encounter for screening for malignant neoplasm of prostate: Secondary | ICD-10-CM

## 2016-02-02 DIAGNOSIS — Z111 Encounter for screening for respiratory tuberculosis: Secondary | ICD-10-CM | POA: Diagnosis not present

## 2016-02-02 DIAGNOSIS — Z1211 Encounter for screening for malignant neoplasm of colon: Secondary | ICD-10-CM | POA: Diagnosis not present

## 2016-02-02 DIAGNOSIS — Z1383 Encounter for screening for respiratory disorder NEC: Secondary | ICD-10-CM | POA: Diagnosis not present

## 2016-02-02 DIAGNOSIS — Z1389 Encounter for screening for other disorder: Secondary | ICD-10-CM | POA: Diagnosis not present

## 2016-02-02 DIAGNOSIS — Z23 Encounter for immunization: Secondary | ICD-10-CM

## 2016-02-02 DIAGNOSIS — Z130 Encounter for screening for diseases of the blood and blood-forming organs and certain disorders involving the immune mechanism: Secondary | ICD-10-CM | POA: Diagnosis not present

## 2016-02-02 DIAGNOSIS — Z136 Encounter for screening for cardiovascular disorders: Secondary | ICD-10-CM

## 2016-02-02 DIAGNOSIS — R3129 Other microscopic hematuria: Secondary | ICD-10-CM

## 2016-02-02 DIAGNOSIS — Z1212 Encounter for screening for malignant neoplasm of rectum: Secondary | ICD-10-CM | POA: Diagnosis not present

## 2016-02-02 LAB — CBC
HCT: 46.9 % (ref 38.5–50.0)
HEMOGLOBIN: 16.2 g/dL (ref 13.2–17.1)
MCH: 30.3 pg (ref 27.0–33.0)
MCHC: 34.5 g/dL (ref 32.0–36.0)
MCV: 87.7 fL (ref 80.0–100.0)
MPV: 9.6 fL (ref 7.5–12.5)
Platelets: 260 10*3/uL (ref 140–400)
RBC: 5.35 MIL/uL (ref 4.20–5.80)
RDW: 13.7 % (ref 11.0–15.0)
WBC: 9.6 10*3/uL (ref 3.8–10.8)

## 2016-02-02 LAB — COMPREHENSIVE METABOLIC PANEL
ALBUMIN: 4.3 g/dL (ref 3.6–5.1)
ALK PHOS: 54 U/L (ref 40–115)
ALT: 21 U/L (ref 9–46)
AST: 19 U/L (ref 10–40)
BUN: 17 mg/dL (ref 7–25)
CHLORIDE: 100 mmol/L (ref 98–110)
CO2: 30 mmol/L (ref 20–31)
CREATININE: 1 mg/dL (ref 0.60–1.35)
Calcium: 9.3 mg/dL (ref 8.6–10.3)
Glucose, Bld: 86 mg/dL (ref 65–99)
POTASSIUM: 3.8 mmol/L (ref 3.5–5.3)
SODIUM: 137 mmol/L (ref 135–146)
TOTAL PROTEIN: 7.4 g/dL (ref 6.1–8.1)
Total Bilirubin: 0.9 mg/dL (ref 0.2–1.2)

## 2016-02-02 LAB — LIPID PANEL
CHOL/HDL RATIO: 4.7 ratio (ref <5.0)
CHOLESTEROL: 193 mg/dL (ref <200)
HDL: 41 mg/dL (ref >40)
LDL CALC: 132 mg/dL — AB (ref <100)
Triglycerides: 101 mg/dL (ref <150)
VLDL: 20 mg/dL (ref <30)

## 2016-02-02 LAB — HIV ANTIBODY (ROUTINE TESTING W REFLEX): HIV: NONREACTIVE

## 2016-02-02 LAB — POCT URINALYSIS DIP (MANUAL ENTRY)
BILIRUBIN UA: NEGATIVE
BILIRUBIN UA: NEGATIVE
GLUCOSE UA: NEGATIVE
Leukocytes, UA: NEGATIVE
Nitrite, UA: NEGATIVE
Protein Ur, POC: NEGATIVE
SPEC GRAV UA: 1.02
UROBILINOGEN UA: 0.2
pH, UA: 6

## 2016-02-02 LAB — PSA: PSA: 0.8 ng/mL (ref <=4.0)

## 2016-02-02 LAB — TSH: TSH: 1.45 m[IU]/L (ref 0.40–4.50)

## 2016-02-02 NOTE — Progress Notes (Signed)
Subjective:  By signing my name below, I, Moises Blood, attest that this documentation has been prepared under the direction and in the presence of Delman Cheadle, MD. Electronically Signed: Moises Blood, [place]. 02/02/2016 , 11:21 AM .  Patient was seen in Room 3 .   Patient ID: [name], male    DOB: 11/27/1965, 50 y.o.   MRN: [id number] Chief Complaint  Patient presents with  . Annual Exam    needs work form completed   HPI [name] is a 50 y.o. male who presents to [facility] for annual physical with work form. 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   Exercise He tries to go to the gym, but he hasn't found enough time. He has 4 children, youngest being 22 months old son. He tries going to the gym once~twice a week.   Lab work He had cholesterol checked 2 years ago, which was borderline.   Cancer Screening He hasn't had colonoscopy yet but was discussed.  He agrees to prostate exam today; he denies feeling any bulges in the lower abdomen.   Immunizations Last tetanus shot was a few years ago at [facility], when accidentally dropped a hammer on his head at home.   Flu shot: had it last time and had weird reaction where there's sharp pains between his knuckles; it resolved the next day. It occurred about a week after the shot. He agrees to flu shot today.   History reviewed. No pertinent past medical history. Prior to Admission medications   Medication Sig Start Date End Date Taking? Authorizing Provider  pantoprazole (PROTONIX) 40 MG tablet TAKE 1 TABLET BY MOUTH EVERY DAY 08/31/15  Yes Marletta Lor, MD  hyoscyamine (LEVSIN SL) 0.125 MG SL tablet Place 1 tablet (0.125 mg total) under the tongue every 4 (four) hours as needed. Patient not taking: Reported on 02/02/2016 05/06/15   Marletta Lor, MD  Multiple Vitamin (MULTIVITAMIN) tablet Take 1 tablet by mouth daily.    Historical Provider, MD   Allergies  Allergen Reactions  .  Loratadine    Review of Systems  Constitutional: Negative for fatigue and unexpected weight change.  Eyes: Negative for visual disturbance.  Respiratory: Negative for cough, chest tightness and shortness of breath.   Cardiovascular: Negative for chest pain, palpitations and leg swelling.  Gastrointestinal: Negative for abdominal pain and blood in stool.  Neurological: Negative for dizziness, light-headedness and headaches.       Objective:   Physical Exam  Constitutional: He is oriented to person, place, and time. He appears well-developed and well-nourished. No distress.  HENT:  Head: Normocephalic and atraumatic.  Eyes: EOM are normal. Pupils are equal, round, and reactive to light.  Neck: Neck supple.  Cardiovascular: Normal rate, regular rhythm and normal heart sounds.   No murmur heard. Pulses:      Dorsalis pedis pulses are 2+ on the right side, and 2+ on the left side.  Pulmonary/Chest: Effort normal and breath sounds normal. No respiratory distress. He has no wheezes.  Abdominal: There is no CVA tenderness.  Genitourinary: Prostate normal.  Genitourinary Comments: Possible small inguinal hernias, L>R  Musculoskeletal: Normal range of motion.  Neurological: He is alert and oriented to person, place, and time.  Reflex Scores:      Patellar reflexes are 2+ on the right side and 2+ on the left side. Skin: Skin is warm and dry.  Psychiatric: He has a normal mood and affect.  His behavior is normal.  Nursing note and vitals reviewed.   BP 104/84 (BP Location: Right Arm, Patient Position: Sitting, Cuff Size: Small)   Pulse 70   Temp 98.2 F (36.8 C) (Oral)   Resp 16   Ht 5' 7.5" (1.715 m)   Wt 187 lb 6.4 oz (85 kg)   SpO2 99%   BMI 28.92 kg/m     Visual Acuity Screening   Right eye Left eye Both eyes  Without correction:     With correction: 20/20 20/15 20/20    [facility] reading (PRIMARY) by  Dr. Brigitte Pulse. NSR, no acute ischemic change    Assessment & Plan:   1. Annual  physical exam   2. Screening for cardiovascular, respiratory, and genitourinary diseases   3. Screening for colorectal cancer   4. Screening for deficiency anemia   5. Screening for prostate cancer   6. Screening for thyroid disorder   7. Screening for tuberculosis   8. Screening for STD (sexually transmitted disease)   9. Need for prophylactic vaccination and inoculation against influenza   10. Microscopic hematuria     Orders Placed This Encounter  Procedures  . Flu Vaccine QUAD 36+ mos IM  . CBC  . Comprehensive metabolic panel    Order Specific Question:   Has the patient fasted?    Answer:   Yes  . HIV antibody  . TSH  . PSA  . Lipid panel    Order Specific Question:   Has the patient fasted?    Answer:   Yes  . Quantiferon tb gold assay  . Ambulatory referral to Gastroenterology    Referral Priority:   Routine    Referral Type:   Consultation    Referral Reason:   Specialty Services Required    Number of Visits Requested:   1  . Care order/instruction:    AVS - please print after vaccine is given  . POCT urinalysis dipstick  . EKG 12-Lead     I personally performed the services described in this documentation, which was scribed in my presence. The recorded information has been reviewed and considered, and addended by me as needed.   Delman Cheadle, M.D.  [facility] [address] [place], [place] [address] [phone] phone [phone] fax  02/03/16 9:34 AM  Results for orders placed or performed in visit on 02/02/16  CBC  Result Value Ref Range   WBC 9.6 3.8 - 10.8 K/uL   RBC 5.35 4.20 - 5.80 MIL/uL   Hemoglobin 16.2 13.2 - 17.1 g/dL   HCT 46.9 38.5 - 50.0 %   MCV 87.7 80.0 - 100.0 fL   MCH 30.3 27.0 - 33.0 pg   MCHC 34.5 32.0 - 36.0 g/dL   RDW 13.7 11.0 - 15.0 %   Platelets 260 140 - 400 K/uL   MPV 9.6 7.5 - 12.5 fL  Comprehensive metabolic panel  Result Value Ref Range   Sodium 137 135 - 146 mmol/L   Potassium 3.8  3.5 - 5.3 mmol/L   Chloride 100 98 - 110 mmol/L   CO2 30 20 - 31 mmol/L   Glucose, Bld 86 65 - 99 mg/dL   BUN 17 7 - 25 mg/dL   Creat 1.00 0.60 - 1.35 mg/dL   Total Bilirubin 0.9 0.2 - 1.2 mg/dL   Alkaline Phosphatase 54 40 - 115 U/L   AST 19 10 - 40 U/L   ALT 21 9 - 46 U/L  Total Protein 7.4 6.1 - 8.1 g/dL   Albumin 4.3 3.6 - 5.1 g/dL   Calcium 9.3 8.6 - 10.3 mg/dL  HIV antibody  Result Value Ref Range   HIV 1&2 Ab, 4th Generation NONREACTIVE NONREACTIVE  TSH  Result Value Ref Range   TSH 1.45 0.40 - 4.50 mIU/L  PSA  Result Value Ref Range   PSA 0.8 <=4.0 ng/mL  Lipid panel  Result Value Ref Range   Cholesterol 193 <200 mg/dL   Triglycerides 101 <150 mg/dL   HDL 41 >40 mg/dL   Total CHOL/HDL Ratio 4.7 <5.0 Ratio   VLDL 20 <30 mg/dL   LDL Cholesterol 132 (H) <100 mg/dL  POCT urinalysis dipstick  Result Value Ref Range   Color, UA yellow yellow   Clarity, UA clear clear   Glucose, UA negative negative   Bilirubin, UA negative negative   Ketones, POC UA negative negative   Spec Grav, UA 1.020    Blood, UA small (A) negative   pH, UA 6.0    Protein Ur, POC negative negative   Urobilinogen, UA 0.2    Nitrite, UA Negative Negative   Leukocytes, UA Negative Negative

## 2016-02-02 NOTE — Patient Instructions (Addendum)
IF you received an x-ray today, you will receive an invoice from [facility]. Please contact [facility] at [phone] with questions or concerns regarding your invoice.   IF you received labwork today, you will receive an invoice from [employer]. Please contact [facility] at [phone] with questions or concerns regarding your invoice.   Our billing staff will not be able to assist you with questions regarding bills from these companies.  You will be contacted with the lab results as soon as they are available. The fastest way to get your results is to activate your My Chart account. Instructions are located on the last page of this paperwork. If you have not heard from [place] regarding the results in 2 weeks, please contact this office.     Health Maintenance, Male A healthy lifestyle and preventative care can promote health and wellness.  Maintain regular health, dental, and eye exams.  Eat a healthy diet. Foods like vegetables, fruits, whole grains, low-fat dairy products, and lean protein foods contain the nutrients you need and are low in calories. Decrease your intake of foods high in solid fats, added sugars, and salt. Get information about a proper diet from your health care provider, if necessary.  Regular physical exercise is one of the most important things you can do for your health. Most adults should get at least 150 minutes of moderate-intensity exercise (any activity that increases your heart rate and causes you to sweat) each week. In addition, most adults need muscle-strengthening exercises on 2 or more days a week.   Maintain a healthy weight. The body mass index (BMI) is a screening tool to identify possible weight problems. It provides an estimate of body fat based on height and weight. Your health care provider can find your BMI and can help you achieve or maintain a healthy weight. For males 20 years and older:  A BMI  below 18.5 is considered underweight.  A BMI of 18.5 to 24.9 is normal.  A BMI of 25 to 29.9 is considered overweight.  A BMI of 30 and above is considered obese.  Maintain normal blood lipids and cholesterol by exercising and minimizing your intake of saturated fat. Eat a balanced diet with plenty of fruits and vegetables. Blood tests for lipids and cholesterol should begin at age 67 and be repeated every 5 years. If your lipid or cholesterol levels are high, you are over age 46, or you are at high risk for heart disease, you may need your cholesterol levels checked more frequently.Ongoing high lipid and cholesterol levels should be treated with medicines if diet and exercise are not working.  If you smoke, find out from your health care provider how to quit. If you do not use tobacco, do not start.  Lung cancer screening is recommended for adults aged 76-80 years who are at high risk for developing lung cancer because of a history of smoking. A yearly low-dose CT scan of the lungs is recommended for people who have at least a 30-pack-year history of smoking and are current smokers or have quit within the past 15 years. A pack year of smoking is smoking an average of 1 pack of cigarettes a day for 1 year (for example, a 30-pack-year history of smoking could mean smoking 1 pack a day for 30 years or 2 packs a day for 15 years). Yearly screening should continue until the smoker has stopped smoking for at least 15 years. Yearly screening should be  stopped for people who develop a health problem that would prevent them from having lung cancer treatment.  If you choose to drink alcohol, do not have more than 2 drinks per day. One drink is considered to be 12 oz (360 mL) of beer, 5 oz (150 mL) of wine, or 1.5 oz (45 mL) of liquor.  Avoid the use of street drugs. Do not share needles with anyone. Ask for help if you need support or instructions about stopping the use of drugs.  High blood pressure  causes heart disease and increases the risk of stroke. High blood pressure is more likely to develop in:  People who have blood pressure in the end of the normal range (100-139/85-89 mm Hg).  People who are overweight or obese.  People who are African American.  If you are 41-64 years of age, have your blood pressure checked every 3-5 years. If you are 54 years of age or older, have your blood pressure checked every year. You should have your blood pressure measured twice-once when you are at a hospital or clinic, and once when you are not at a hospital or clinic. Record the average of the two measurements. To check your blood pressure when you are not at a hospital or clinic, you can use:  An automated blood pressure machine at a pharmacy.  A home blood pressure monitor.  If you are 63-82 years old, ask your health care provider if you should take aspirin to prevent heart disease.  Diabetes screening involves taking a blood sample to check your fasting blood sugar level. This should be done once every 3 years after age 90 if you are at a normal weight and without risk factors for diabetes. Testing should be considered at a younger age or be carried out more frequently if you are overweight and have at least 1 risk factor for diabetes.  Colorectal cancer can be detected and often prevented. Most routine colorectal cancer screening begins at the age of 70 and continues through age 12. However, your health care provider may recommend screening at an earlier age if you have risk factors for colon cancer. On a yearly basis, your health care provider may provide home test kits to check for hidden blood in the stool. A small camera at the end of a tube may be used to directly examine the colon (sigmoidoscopy or colonoscopy) to detect the earliest forms of colorectal cancer. Talk to your health care provider about this at age 10 when routine screening begins. A direct exam of the colon should be repeated  every 5-10 years through age 45, unless early forms of precancerous polyps or small growths are found.  People who are at an increased risk for hepatitis B should be screened for this virus. You are considered at high risk for hepatitis B if:  You were born in a country where hepatitis B occurs often. Talk with your health care provider about which countries are considered high risk.  Your parents were born in a high-risk country and you have not received a shot to protect against hepatitis B (hepatitis B vaccine).  You have HIV or AIDS.  You use needles to inject street drugs.  You live with, or have sex with, someone who has hepatitis B.  You are a man who has sex with other men (MSM).  You get hemodialysis treatment.  You take certain medicines for conditions like cancer, organ transplantation, and autoimmune conditions.  Hepatitis C blood testing is recommended  for all people born from [birth year] through 1964 and any individual with known risk factors for hepatitis C.  Healthy men should no longer receive prostate-specific antigen (PSA) blood tests as part of routine cancer screening. Talk to your health care provider about prostate cancer screening.  Testicular cancer screening is not recommended for adolescents or adult males who have no symptoms. Screening includes self-exam, a health care provider exam, and other screening tests. Consult with your health care provider about any symptoms you have or any concerns you have about testicular cancer.  Practice safe sex. Use condoms and avoid high-risk sexual practices to reduce the spread of sexually transmitted infections (STIs).  You should be screened for STIs, including gonorrhea and chlamydia if:  You are sexually active and are younger than 24 years.  You are older than 24 years, and your health care provider tells you that you are at risk for this type of infection.  Your sexual activity has changed since you were last screened,  and you are at an increased risk for chlamydia or gonorrhea. Ask your health care provider if you are at risk.  If you are at risk of being infected with HIV, it is recommended that you take a prescription medicine daily to prevent HIV infection. This is called pre-exposure prophylaxis (PrEP). You are considered at risk if:  You are a man who has sex with other men (MSM).  You are a heterosexual man who is sexually active with multiple partners.  You take drugs by injection.  You are sexually active with a partner who has HIV.  Talk with your health care provider about whether you are at high risk of being infected with HIV. If you choose to begin PrEP, you should first be tested for HIV. You should then be tested every 3 months for as long as you are taking PrEP.  Use sunscreen. Apply sunscreen liberally and repeatedly throughout the day. You should seek shade when your shadow is shorter than you. Protect yourself by wearing long sleeves, pants, a wide-brimmed hat, and sunglasses year round whenever you are outdoors.  Tell your health care provider of new moles or changes in moles, especially if there is a change in shape or color. Also, tell your health care provider if a mole is larger than the size of a pencil eraser.  A one-time screening for abdominal aortic aneurysm (AAA) and surgical repair of large AAAs by ultrasound is recommended for men aged 4-75 years who are current or former smokers.  Stay current with your vaccines (immunizations). This information is not intended to replace advice given to you by your health care provider. Make sure you discuss any questions you have with your health care provider. Document Released: 09/03/2007 Document Revised: 03/28/2014 Document Reviewed: 12/09/2014 Elsevier Interactive Patient Education  2017 [employer].

## 2016-02-04 LAB — QUANTIFERON TB GOLD ASSAY (BLOOD)
INTERFERON GAMMA RELEASE ASSAY: NEGATIVE
Mitogen-Nil: 7.26 IU/mL
QUANTIFERON NIL VALUE: 0.14 [IU]/mL
Quantiferon Tb Ag Minus Nil Value: <0 IU/mL

## 2016-03-11 ENCOUNTER — Encounter: Payer: Self-pay | Admitting: Family Medicine

## 2016-06-07 ENCOUNTER — Ambulatory Visit: Payer: 59 | Admitting: Family Medicine

## 2016-08-02 ENCOUNTER — Ambulatory Visit (INDEPENDENT_AMBULATORY_CARE_PROVIDER_SITE_OTHER): Payer: 59 | Admitting: Family Medicine

## 2016-08-02 ENCOUNTER — Encounter: Payer: Self-pay | Admitting: Family Medicine

## 2016-08-02 VITALS — BP 120/80 | HR 102 | Temp 99.0°F | Wt 191.3 lb

## 2016-08-02 DIAGNOSIS — J069 Acute upper respiratory infection, unspecified: Secondary | ICD-10-CM | POA: Diagnosis not present

## 2016-08-02 DIAGNOSIS — B9789 Other viral agents as the cause of diseases classified elsewhere: Secondary | ICD-10-CM

## 2016-08-02 MED ORDER — HYDROCODONE-HOMATROPINE 5-1.5 MG/5ML PO SYRP: 5.0000 mL | ORAL_SOLUTION | Freq: Four times a day (QID) | ORAL | 0 refills | Status: AC | PRN

## 2016-08-02 NOTE — Patient Instructions (Signed)

## 2016-08-02 NOTE — Progress Notes (Signed)
Subjective:     Patient ID: [name], male   DOB: 1965/04/02, 51 y.o.   MRN: [id number]  HPI Patient seen with about 3 history of cough. Last week he had increased nasal congestive symptoms. He felt this may have been allergic related. He was taking some over-the-counter antihistamine. Denies any fever or body aches. Cough mostly nonproductive. Severe at night. Not relieved with over-the-counter medication. No hemoptysis. No dyspnea.  No past medical history on file. No past surgical history on file.  reports that he has never smoked. He has never used smokeless tobacco. He reports that he does not drink alcohol or use drugs. family history is not on file. Allergies  Allergen Reactions  . Loratadine      Review of Systems  Constitutional: Negative for chills and fever.  HENT: Positive for congestion.   Respiratory: Positive for cough. Negative for shortness of breath and wheezing.        Objective:   Physical Exam  Constitutional: He appears well-developed and well-nourished.  HENT:  Right Ear: External ear normal.  Left Ear: External ear normal.  Mouth/Throat: Oropharynx is clear and moist. No oropharyngeal exudate.  Neck: Neck supple.  Cardiovascular: Normal rate and regular rhythm.   Pulmonary/Chest: Effort normal and breath sounds normal. No respiratory distress. He has no wheezes. He has no rales.  Lymphadenopathy:    He has no cervical adenopathy.       Assessment:     Cough. Suspect acute viral bronchitis. Nonfocal exam.    Plan:     -Hycodan cough syrup 1 teaspoon every at bedtime when necessary -Follow-up promptly for any fever or increasing shortness of breath  Eulas Post MD [facility]

## 2016-08-09 ENCOUNTER — Telehealth: Payer: Self-pay | Admitting: Internal Medicine

## 2016-08-09 MED ORDER — DOXYCYCLINE HYCLATE 100 MG PO TABS: 100.0000 mg | ORAL_TABLET | Freq: Two times a day (BID) | ORAL | 0 refills | Status: DC

## 2016-08-09 NOTE — Telephone Encounter (Signed)
If no allergies, may start Doxycycline 100 mg po bid for 7 days.  Follow up for any fever or worsening symptoms.

## 2016-08-09 NOTE — Telephone Encounter (Signed)
Left message on machine for patient to return our call 

## 2016-08-09 NOTE — Telephone Encounter (Signed)
°  Pt said he saw Dr Elease Hashimoto last week   Pt call to say he may need a antibiotic he still has the cough and phelm is discolored    Pharmacy [facility]

## 2016-08-09 NOTE — Telephone Encounter (Signed)
Patient is aware and Rx sent 

## 2016-08-09 NOTE — Addendum Note (Signed)
Addended by: Westley Hummer B on: 08/09/2016 02:12 PM   Modules accepted: Orders

## 2016-08-18 ENCOUNTER — Ambulatory Visit (INDEPENDENT_AMBULATORY_CARE_PROVIDER_SITE_OTHER): Payer: 59 | Admitting: Family Medicine

## 2016-08-18 ENCOUNTER — Encounter: Payer: Self-pay | Admitting: Family Medicine

## 2016-08-18 VITALS — BP 126/82 | HR 70 | Temp 98.3°F | Ht 67.5 in | Wt 188.0 lb

## 2016-08-18 DIAGNOSIS — J209 Acute bronchitis, unspecified: Secondary | ICD-10-CM | POA: Diagnosis not present

## 2016-08-18 MED ORDER — AZITHROMYCIN 250 MG PO TABS: ORAL_TABLET | ORAL | 0 refills | Status: DC

## 2016-08-18 MED ORDER — METHYLPREDNISOLONE 4 MG PO TBPK: ORAL_TABLET | ORAL | 0 refills | Status: DC

## 2016-08-18 NOTE — Progress Notes (Signed)
   Subjective:    Patient ID: [name], male    DOB: [birth year], 51 y.o.   MRN: [id number]  HPI Here for continued coughing. He was seen here a week ago for chest congestion and a dry cough. He took 7 days od Doxycycline and he improved a bit, but he still has a hard dry cough. No fever. Using Hycodan at night.    Review of Systems  Constitutional: Negative.   HENT: Negative.   Eyes: Negative.   Respiratory: Positive for cough, chest tightness and wheezing. Negative for shortness of breath.   Cardiovascular: Negative.        Objective:   Physical Exam  Constitutional: He appears well-developed and well-nourished.  HENT:  Right Ear: External ear normal.  Left Ear: External ear normal.  Nose: Nose normal.  Mouth/Throat: Oropharynx is clear and moist.  Eyes: Conjunctivae are normal.  Neck: No thyromegaly present.  Cardiovascular: Normal rate, regular rhythm, normal heart sounds and intact distal pulses.   Pulmonary/Chest: Effort normal. No respiratory distress. He has no rales.  Soft scattered wheezes   Lymphadenopathy:    He has no cervical adenopathy.          Assessment & Plan:  Partially treated bronchitis. Given a Zpack and a Medrol dose pack.  Alysia Penna, MD

## 2016-08-18 NOTE — Patient Instructions (Signed)
WE NOW OFFER   [facility] FAST TRACK!!!  SAME DAY Appointments for ACUTE CARE  Such as: Sprains, Injuries, cuts, abrasions, rashes, muscle pain, joint pain, back pain Colds, flu, sore throats, headache, allergies, cough, fever  Ear pain, sinus and eye infections Abdominal pain, nausea, vomiting, diarrhea, upset stomach Animal/insect bites  3 Easy Ways to Schedule: Walk-In Scheduling Call in scheduling Mychart Sign-up: [email or web address]         

## 2016-09-13 ENCOUNTER — Other Ambulatory Visit: Payer: Self-pay | Admitting: Internal Medicine

## 2016-11-30 ENCOUNTER — Other Ambulatory Visit: Payer: Self-pay | Admitting: Internal Medicine

## 2016-12-08 ENCOUNTER — Encounter: Payer: Self-pay | Admitting: Internal Medicine

## 2017-01-05 ENCOUNTER — Other Ambulatory Visit: Payer: Self-pay | Admitting: Internal Medicine

## 2017-04-02 ENCOUNTER — Other Ambulatory Visit: Payer: Self-pay | Admitting: Internal Medicine

## 2017-07-25 ENCOUNTER — Other Ambulatory Visit: Payer: Self-pay | Admitting: Internal Medicine

## 2017-08-30 ENCOUNTER — Ambulatory Visit: Payer: BLUE CROSS/BLUE SHIELD | Admitting: Internal Medicine

## 2017-08-30 ENCOUNTER — Encounter: Payer: Self-pay | Admitting: Gastroenterology

## 2017-08-30 ENCOUNTER — Encounter: Payer: Self-pay | Admitting: Internal Medicine

## 2017-08-30 VITALS — BP 102/70 | HR 67 | Temp 98.1°F | Wt 186.0 lb

## 2017-08-30 DIAGNOSIS — Z125 Encounter for screening for malignant neoplasm of prostate: Secondary | ICD-10-CM

## 2017-08-30 DIAGNOSIS — Z0000 Encounter for general adult medical examination without abnormal findings: Secondary | ICD-10-CM

## 2017-08-30 DIAGNOSIS — M546 Pain in thoracic spine: Secondary | ICD-10-CM

## 2017-08-30 DIAGNOSIS — M25512 Pain in left shoulder: Secondary | ICD-10-CM

## 2017-08-30 LAB — LIPID PANEL
CHOLESTEROL: 197 mg/dL (ref 0–200)
HDL: 34.9 mg/dL — ABNORMAL LOW (ref >39.00)
LDL Cholesterol: 124 mg/dL — ABNORMAL HIGH (ref 0–99)
NonHDL: 161.64
Total CHOL/HDL Ratio: 6
Triglycerides: 187 mg/dL — ABNORMAL HIGH (ref 0.0–149.0)
VLDL: 37.4 mg/dL (ref 0.0–40.0)

## 2017-08-30 LAB — COMPREHENSIVE METABOLIC PANEL
ALBUMIN: 4.2 g/dL (ref 3.5–5.2)
ALK PHOS: 52 U/L (ref 39–117)
ALT: 42 U/L (ref 0–53)
AST: 29 U/L (ref 0–37)
BILIRUBIN TOTAL: 0.7 mg/dL (ref 0.2–1.2)
BUN: 24 mg/dL — AB (ref 6–23)
CHLORIDE: 102 meq/L (ref 96–112)
CO2: 27 mEq/L (ref 19–32)
CREATININE: 0.96 mg/dL (ref 0.40–1.50)
Calcium: 9.3 mg/dL (ref 8.4–10.5)
GFR: 87.61 mL/min (ref >60.00)
Glucose, Bld: 102 mg/dL — ABNORMAL HIGH (ref 70–99)
Potassium: 4 mEq/L (ref 3.5–5.1)
Sodium: 137 mEq/L (ref 135–145)
TOTAL PROTEIN: 7.2 g/dL (ref 6.0–8.3)

## 2017-08-30 LAB — CBC WITH DIFFERENTIAL/PLATELET
Basophils Absolute: 0 10*3/uL (ref 0.0–0.1)
Basophils Relative: 0.7 % (ref 0.0–3.0)
EOS ABS: 0.2 10*3/uL (ref 0.0–0.7)
EOS PCT: 3.8 % (ref 0.0–5.0)
HCT: 42.9 % (ref 39.0–52.0)
Hemoglobin: 15.2 g/dL (ref 13.0–17.0)
LYMPHS ABS: 1.9 10*3/uL (ref 0.7–4.0)
Lymphocytes Relative: 30.7 % (ref 12.0–46.0)
MCHC: 35.4 g/dL (ref 30.0–36.0)
MCV: 85.9 fl (ref 78.0–100.0)
MONO ABS: 0.6 10*3/uL (ref 0.1–1.0)
Monocytes Relative: 10.3 % (ref 3.0–12.0)
NEUTROS PCT: 54.5 % (ref 43.0–77.0)
Neutro Abs: 3.4 10*3/uL (ref 1.4–7.7)
Platelets: 282 10*3/uL (ref 150.0–400.0)
RBC: 5 Mil/uL (ref 4.22–5.81)
RDW: 13.6 % (ref 11.5–15.5)
WBC: 6.3 10*3/uL (ref 4.0–10.5)

## 2017-08-30 LAB — TSH: TSH: 1.65 u[IU]/mL (ref 0.35–4.50)

## 2017-08-30 LAB — PSA: PSA: 1.01 ng/mL (ref 0.10–4.00)

## 2017-08-30 NOTE — Patient Instructions (Signed)
Return in the fall for a flu vaccine and to establish with a new provider

## 2017-08-30 NOTE — Progress Notes (Signed)
Subjective:    Patient ID: [name], male    DOB: 02-16-1966, 52 y.o.   MRN: [id number]  HPI  52 year old patient who presents with a 10-day history of left upper back and shoulder pain.  He has been very active with construction activities about his house.  Pain is aggravated by movement.  It has improved significantly but the patient was concerned about shoulder pain being a symptom of heart disease.  He has no other cardiovascular risk factors.  History reviewed. No pertinent past medical history.   Social History   Socioeconomic History  . Marital status: Married    Spouse name: Not on file  . Number of children: Not on file  . Years of education: Not on file  . Highest education level: Not on file  Occupational History  . Not on file  Social Needs  . Financial resource strain: Not on file  . Food insecurity:    Worry: Not on file    Inability: Not on file  . Transportation needs:    Medical: Not on file    Non-medical: Not on file  Tobacco Use  . Smoking status: Never Smoker  . Smokeless tobacco: Never Used  Substance and Sexual Activity  . Alcohol use: No  . Drug use: No  . Sexual activity: Not on file  Lifestyle  . Physical activity:    Days per week: Not on file    Minutes per session: Not on file  . Stress: Not on file  Relationships  . Social connections:    Talks on phone: Not on file    Gets together: Not on file    Attends religious service: Not on file    Active member of club or organization: Not on file    Attends meetings of clubs or organizations: Not on file    Relationship status: Not on file  . Intimate partner violence:    Fear of current or ex partner: Not on file    Emotionally abused: Not on file    Physically abused: Not on file    Forced sexual activity: Not on file  Other Topics Concern  . Not on file  Social History Narrative  . Not on file    History reviewed. No pertinent surgical history.  History reviewed. No  pertinent family history.  Allergies  Allergen Reactions  . Loratadine     Current Outpatient Medications on File Prior to Visit  Medication Sig Dispense Refill  . pantoprazole (PROTONIX) 40 MG tablet TAKE 1 TABLET BY MOUTH EVERY DAY 90 tablet 0   No current facility-administered medications on file prior to visit.     BP 102/70 (BP Location: Right Arm, Patient Position: Sitting, Cuff Size: Large)   Pulse 67   Temp 98.1 F (36.7 C) (Oral)   Wt 186 lb (84.4 kg)   SpO2 97%   BMI 28.70 kg/m      Review of Systems  Constitutional: Negative for appetite change, chills, fatigue and fever.  HENT: Negative for congestion, dental problem, ear pain, hearing loss, sore throat, tinnitus, trouble swallowing and voice change.   Eyes: Negative for pain, discharge and visual disturbance.  Respiratory: Negative for cough, chest tightness, wheezing and stridor.   Cardiovascular: Negative for chest pain, palpitations and leg swelling.  Gastrointestinal: Negative for abdominal distention, abdominal pain, blood in stool, constipation, diarrhea, nausea and vomiting.  Genitourinary: Negative for difficulty urinating, discharge, flank pain, genital sores, hematuria and urgency.  Musculoskeletal: Negative for  arthralgias, back pain, gait problem, joint swelling, myalgias and neck stiffness.       Left shoulder and upper back pain aggravated by movement  Skin: Negative for rash.  Neurological: Negative for dizziness, syncope, speech difficulty, weakness, numbness and headaches.  Hematological: Negative for adenopathy. Does not bruise/bleed easily.  Psychiatric/Behavioral: Negative for behavioral problems and dysphoric mood. The patient is not nervous/anxious.        Objective:   Physical Exam  Constitutional: He is oriented to person, place, and time. He appears well-developed.  Blood pressure low normal  HENT:  Head: Normocephalic.  Right Ear: External ear normal.  Left Ear: External ear  normal.  Eyes: Conjunctivae and EOM are normal.  Neck: Normal range of motion.  Cardiovascular: Normal rate and normal heart sounds.  Pulmonary/Chest: Breath sounds normal.  Abdominal: Bowel sounds are normal.  Musculoskeletal: Normal range of motion. He exhibits no edema or tenderness.  Full range of motion left shoulderNo focal tenderness  Neurological: He is alert and oriented to person, place, and time.  Psychiatric: He has a normal mood and affect. His behavior is normal.          Assessment & Plan:  Musculoligamentous left shoulder pain improved.  Patient reassured Preventive health.  Will check updated labs and schedule for initial colonoscopy  Patient will establish with a new provider in the fall  Marletta Lor

## 2017-10-24 ENCOUNTER — Other Ambulatory Visit: Payer: Self-pay | Admitting: Internal Medicine

## 2017-11-07 ENCOUNTER — Encounter: Payer: Self-pay | Admitting: Internal Medicine

## 2017-11-10 ENCOUNTER — Encounter: Payer: Self-pay | Admitting: Gastroenterology

## 2017-11-13 ENCOUNTER — Encounter: Payer: BLUE CROSS/BLUE SHIELD | Admitting: Gastroenterology

## 2017-12-20 ENCOUNTER — Ambulatory Visit (AMBULATORY_SURGERY_CENTER): Payer: Self-pay

## 2017-12-20 ENCOUNTER — Encounter: Payer: Self-pay | Admitting: Gastroenterology

## 2017-12-20 VITALS — Ht 69.0 in | Wt 177.0 lb

## 2017-12-20 DIAGNOSIS — Z1211 Encounter for screening for malignant neoplasm of colon: Secondary | ICD-10-CM

## 2017-12-20 MED ORDER — NA SULFATE-K SULFATE-MG SULF 17.5-3.13-1.6 GM/177ML PO SOLN: 1.0000 | Freq: Once | ORAL | 0 refills | Status: AC

## 2017-12-20 NOTE — Progress Notes (Signed)
No egg or soy allergy known to patient  No issues with past sedation with any surgeries  or procedures, no intubation problems  No diet pills per patient No home 02 use per patient  No blood thinners per patient  Pt denies issues with constipation  No A fib or A flutter  EMMI video sent to pt's e mail  

## 2018-01-03 ENCOUNTER — Encounter: Payer: Self-pay | Admitting: Gastroenterology

## 2018-01-03 ENCOUNTER — Ambulatory Visit (AMBULATORY_SURGERY_CENTER): Payer: BLUE CROSS/BLUE SHIELD | Admitting: Gastroenterology

## 2018-01-03 VITALS — BP 105/64 | HR 68 | Temp 99.1°F | Resp 11 | Ht 69.0 in | Wt 177.0 lb

## 2018-01-03 DIAGNOSIS — K635 Polyp of colon: Secondary | ICD-10-CM | POA: Diagnosis not present

## 2018-01-03 DIAGNOSIS — Z1211 Encounter for screening for malignant neoplasm of colon: Secondary | ICD-10-CM

## 2018-01-03 DIAGNOSIS — D125 Benign neoplasm of sigmoid colon: Secondary | ICD-10-CM | POA: Diagnosis not present

## 2018-01-03 DIAGNOSIS — D123 Benign neoplasm of transverse colon: Secondary | ICD-10-CM

## 2018-01-03 MED ORDER — SODIUM CHLORIDE 0.9 % IV SOLN: 500.0000 mL | Freq: Once | INTRAVENOUS | Status: DC

## 2018-01-03 NOTE — Progress Notes (Signed)
Called to room to assist during endoscopic procedure.  Patient ID and intended procedure confirmed with present staff. Received instructions for my participation in the procedure from the performing physician.  

## 2018-01-03 NOTE — Op Note (Signed)
[facility] Patient Name: [name] Procedure Date: 01/03/2018 1:25 PM MRN: [id number] Endoscopist: Ladene Artist , MD Age: 52 Referring MD:  Date of Birth: 1965-06-12 Gender: Male Account #: [id number] Procedure:                Colonoscopy Indications:              Screening for colorectal malignant neoplasm Medicines:                Monitored Anesthesia Care Procedure:                Pre-Anesthesia Assessment:                           - Prior to the procedure, a History and Physical                            was performed, and patient medications and                            allergies were reviewed. The patient's tolerance of                            previous anesthesia was also reviewed. The risks                            and benefits of the procedure and the sedation                            options and risks were discussed with the patient.                            All questions were answered, and informed consent                            was obtained. Prior Anticoagulants: The patient has                            taken no previous anticoagulant or antiplatelet                            agents. ASA Grade Assessment: II - A patient with                            mild systemic disease. After reviewing the risks                            and benefits, the patient was deemed in                            satisfactory condition to undergo the procedure.                           After obtaining informed consent, the colonoscope  was passed under direct vision. Throughout the                            procedure, the patient's blood pressure, pulse, and                            oxygen saturations were monitored continuously. The                            Colonoscope was introduced through the anus and                            advanced to the the cecum, identified by                            appendiceal orifice and  ileocecal valve. The                            ileocecal valve, appendiceal orifice, and rectum                            were photographed. The quality of the bowel                            preparation was excellent. The colonoscopy was                            performed without difficulty. The patient tolerated                            the procedure well. Scope In: 1:42:14 PM Scope Out: 2:01:58 PM Scope Withdrawal Time: 0 hours 15 minutes 47 seconds  Total Procedure Duration: 0 hours 19 minutes 44 seconds  Findings:                 The perianal and digital rectal examinations were                            normal.                           Two sessile polyps were found in the sigmoid colon                            and transverse colon. The polyps were 6 to 7 mm in                            size. These polyps were removed with a cold snare.                            Resection and retrieval were complete.                           The exam was otherwise without abnormality on  direct and retroflexion views. Complications:            No immediate complications. Estimated blood loss:                            None. Estimated Blood Loss:     Estimated blood loss: none. Impression:               - Two 6 to 7 mm polyps in the sigmoid colon and in                            the transverse colon, removed with a cold snare.                            Resected and retrieved.                           - The examination was otherwise normal on direct                            and retroflexion views. Recommendation:           - Repeat colonoscopy in 5 years for surveillance if                            polyp(s) are precancerous, otherwise 10 years.                           - Patient has a contact number available for                            emergencies. The signs and symptoms of potential                            delayed complications were discussed  with the                            patient. Return to normal activities tomorrow.                            Written discharge instructions were provided to the                            patient.                           - Resume previous diet.                           - Continue present medications.                           - Await pathology results. Ladene Artist, MD 01/03/2018 2:09:10 PM This report has been signed electronically.

## 2018-01-03 NOTE — Progress Notes (Signed)
A/ox3 pleased with MAC, report to RN 

## 2018-01-03 NOTE — Patient Instructions (Signed)
YOU HAD AN ENDOSCOPIC PROCEDURE TODAY AT THE [facility]:   Refer to the procedure report that was given to you for any specific questions about what was found during the examination.  If the procedure report does not answer your questions, please call your gastroenterologist to clarify.  If you requested that your care partner not be given the details of your procedure findings, then the procedure report has been included in a sealed envelope for you to review at your convenience later.  YOU SHOULD EXPECT: Some feelings of bloating in the abdomen. Passage of more gas than usual.  Walking can help get rid of the air that was put into your GI tract during the procedure and reduce the bloating. If you had a lower endoscopy (such as a colonoscopy or flexible sigmoidoscopy) you may notice spotting of blood in your stool or on the toilet paper. If you underwent a bowel prep for your procedure, you may not have a normal bowel movement for a few days.  Please Note:  You might notice some irritation and congestion in your nose or some drainage.  This is from the oxygen used during your procedure.  There is no need for concern and it should clear up in a day or so.  SYMPTOMS TO REPORT IMMEDIATELY:   Following lower endoscopy (colonoscopy or flexible sigmoidoscopy):  Excessive amounts of blood in the stool  Significant tenderness or worsening of abdominal pains  Swelling of the abdomen that is new, acute  Fever of 100F or higher  For urgent or emergent issues, a gastroenterologist can be reached at any hour by calling [phone].   DIET:  We do recommend a small meal at first, but then you may proceed to your regular diet.  Drink plenty of fluids but you should avoid alcoholic beverages for 24 hours.  ACTIVITY:  You should plan to take it easy for the rest of today and you should NOT DRIVE or use heavy machinery until tomorrow (because of the sedation medicines used during the test).     FOLLOW UP: Our staff will call the number listed on your records the next business day following your procedure to check on you and address any questions or concerns that you may have regarding the information given to you following your procedure. If we do not reach you, we will leave a message.  However, if you are feeling well and you are not experiencing any problems, there is no need to return our call.  We will assume that you have returned to your regular daily activities without incident.  If any biopsies were taken you will be contacted by phone or by letter within the next 1-3 weeks.  Please call us at [phone] if you have not heard about the biopsies in 3 weeks.    SIGNATURES/CONFIDENTIALITY: You and/or your care partner have signed paperwork which will be entered into your electronic medical record.  These signatures attest to the fact that that the information above on your After Visit Summary has been reviewed and is understood.  Full responsibility of the confidentiality of this discharge information lies with you and/or your care-partner. 

## 2018-01-04 ENCOUNTER — Telehealth: Payer: Self-pay

## 2018-01-04 NOTE — Telephone Encounter (Signed)
  Follow up Call-  Call [name] number 01/03/2018  Post procedure Call Melinda Pottinger phone  # [phone]  Permission to leave phone message Yes  Some recent data might be hidden     Patient questions:  Do you have a fever, pain , or abdominal swelling? No. Pain Score  0 *  Have you tolerated food without any problems? Yes.    Have you been able to return to your normal activities? Yes.    Do you have any questions about your discharge instructions: Diet   No. Medications  No. Follow up visit  No.  Do you have questions or concerns about your Care? No.  Actions: * If pain score is 4 or above: No action needed, pain <4.

## 2018-01-04 NOTE — Telephone Encounter (Signed)
No answer, left message to call back later today, B.Waniya Hoglund RN. 

## 2018-01-17 ENCOUNTER — Encounter: Payer: Self-pay | Admitting: Gastroenterology

## 2018-04-25 ENCOUNTER — Other Ambulatory Visit: Payer: Self-pay | Admitting: Internal Medicine

## 2018-04-26 ENCOUNTER — Other Ambulatory Visit: Payer: Self-pay | Admitting: Internal Medicine

## 2018-04-26 NOTE — Telephone Encounter (Signed)
Patient transferring care to Dr. Jerilee Hoh 04/27/18  [facility] # [address], [place] - [place]  [address] [place] [address]  Phone: [phone] Fax: [phone]

## 2018-04-26 NOTE — Telephone Encounter (Signed)
Requested medication (s) are due for refill today: Yes  Requested medication (s) are on the active medication list: Yes  Last refill:  10/25/17  Future visit scheduled: No  Notes to clinic:  Patient is finding a new doctor in network and requesting a refill until March, when he will have a new doctor appointment.     Requested Prescriptions  Pending Prescriptions Disp Refills   pantoprazole (PROTONIX) 40 MG tablet 90 tablet 1    Sig: Take 1 tablet (40 mg total) by mouth daily.     Gastroenterology: Proton Pump Inhibitors Passed - 04/26/2018  4:22 PM      Passed - Valid encounter within last 12 months    Recent Outpatient Visits          7 months ago Encounter for preventive health examination   [facility] at [facility], Doretha Sou, MD   1 year ago Acute bronchitis, unspecified organism   Therap[facility], [facility] at [facility], Ishmael Holter, MD   1 year ago Viral URI with cough   [place] [facility], Alinda Sierras, MD   2 years ago Annual physical exam   [facility], Laurey Arrow, MD   2 years ago Gastroesophageal reflux disease without esophagitis   Therapist, [facility] at [facility], Doretha Sou, MD

## 2018-04-26 NOTE — Telephone Encounter (Signed)
Copied from [place] [id number] [phone]. Topic: Quick Communication - Rx Refill/Question >> Apr 26, 2018  4:03 PM [name] wrote: Medication: pantoprazole (PROTONIX) 40 MG tablet  Patient just found out that his insurance is not in network can not get appointment with new Dr till march. Asking please for refill until then  Has the patient contacted their pharmacy? Yes.   (Agent: If no, request that the patient contact the pharmacy for the refill.) (Agent: If yes, when and what did the pharmacy advise?)  Preferred Pharmacy (with phone number or street name): [facility] # [address] [place] [phone] (Phone) [phone]     Agent: Please be advised that RX refills may take up to 3 business days. We ask that you follow-up with your pharmacy.

## 2018-04-27 ENCOUNTER — Encounter: Payer: BLUE CROSS/BLUE SHIELD | Admitting: Internal Medicine

## 2018-04-27 MED ORDER — PANTOPRAZOLE SODIUM 40 MG PO TBEC: 40.0000 mg | DELAYED_RELEASE_TABLET | Freq: Every day | ORAL | 0 refills | Status: AC

## 2020-02-05 ENCOUNTER — Ambulatory Visit (HOSPITAL_COMMUNITY): Admit: 2020-02-05 | Discharge status: Absent without leave | Payer: BLUE CROSS/BLUE SHIELD

## 2020-02-05 ENCOUNTER — Encounter (HOSPITAL_COMMUNITY): Payer: Self-pay

## 2020-02-05 ENCOUNTER — Ambulatory Visit (HOSPITAL_COMMUNITY)
Admission: EM | Admit: 2020-02-05 | Discharge: 2020-02-05 | Disposition: A | Discharge status: Home / Self Care | Payer: BLUE CROSS/BLUE SHIELD | Attending: Family Medicine | Admitting: Family Medicine

## 2020-02-05 DIAGNOSIS — R0981 Nasal congestion: Secondary | ICD-10-CM | POA: Insufficient documentation

## 2020-02-05 DIAGNOSIS — R062 Wheezing: Secondary | ICD-10-CM | POA: Diagnosis not present

## 2020-02-05 DIAGNOSIS — K219 Gastro-esophageal reflux disease without esophagitis: Secondary | ICD-10-CM | POA: Diagnosis not present

## 2020-02-05 DIAGNOSIS — R059 Cough, unspecified: Secondary | ICD-10-CM

## 2020-02-05 DIAGNOSIS — J329 Chronic sinusitis, unspecified: Secondary | ICD-10-CM | POA: Insufficient documentation

## 2020-02-05 DIAGNOSIS — Z20822 Contact with and (suspected) exposure to covid-19: Secondary | ICD-10-CM | POA: Insufficient documentation

## 2020-02-05 MED ORDER — AMOXICILLIN 875 MG PO TABS: 875.0000 mg | ORAL_TABLET | Freq: Two times a day (BID) | ORAL | 0 refills | Status: AC

## 2020-02-05 NOTE — ED Triage Notes (Signed)
Pt c/o productive cough, congestion, runny nose for approx 2.5 weeks. States he had yellow secretions, but they have now turned clear.  States the past few days he feels "wheezing" that improves with coughing.   Inspiratory wheeze heard on inspiration to left upper lobe, mild crackles to lower lobes.  No apparent distress. Denies n/v/d, fever, SOB, sore throat, ear pain.  States he had both COVID vaccines.

## 2020-02-05 NOTE — Discharge Instructions (Signed)
You have been tested for COVID-19 today. °If your test returns positive, you will receive a phone call from [place] regarding your results. °Negative test results are not called. °Both positive and negative results area always visible on MyChart. °If you do not have a MyChart account, sign up instructions are provided in your discharge papers. °Please do not hesitate to contact us should you have questions or concerns. ° °

## 2020-02-06 LAB — SARS CORONAVIRUS 2 (TAT 6-24 HRS): SARS Coronavirus 2: NEGATIVE

## 2020-02-08 NOTE — ED Provider Notes (Signed)
[place]   [id number] 02/05/20 Arrival Time: 1761  ASSESSMENT & PLAN:  1. Cough   2. Wheezing   3. Nasal congestion      COVID-19 testing sent. See letter/work note on file for self-isolation guidelines. OTC symptom care as needed.  Will tx for sinusitis with: Meds ordered this encounter  Medications  . amoxicillin (AMOXIL) 875 MG tablet    Sig: Take 1 tablet (875 mg total) by mouth 2 (two) times daily for 10 days.    Dispense:  20 tablet    Refill:  0     Follow-up Information    Bartholome Bill, MD.   Specialty: Family Medicine Why: As needed. Contact information: [place] [address] [phone]               Reviewed expectations re: course of current medical issues. Questions answered. Outlined signs and symptoms indicating need for more acute intervention. Understanding verbalized. After Visit Summary given.   SUBJECTIVE: History from: patient. [name] is a 54 y.o. male who presents with cough, congestion, runny nose; 3-4 weeks; sinus pressure that is worsening; past few days with question of wheezing at times. Afebrile. No SOB or CP. Normal PO intake without n/v/d.    OBJECTIVE:  Vitals:   02/05/20 1849  BP: (!) 129/97  Pulse: 72  Resp: 18  Temp: 97.7 F (36.5 C)  TempSrc: Oral  SpO2: 100%    General appearance: alert; no distress Eyes: PERRLA; EOMI; conjunctiva normal HENT: [place]; AT; with nasal congestion; bilateral max sinus TTP Neck: supple  Lungs: speaks full sentences without difficulty; unlabored Extremities: no edema Skin: warm and dry Neurologic: normal gait Psychological: alert and cooperative; normal mood and affect  Labs:  Labs Reviewed  SARS CORONAVIRUS 2 (TAT 6-24 HRS)     Allergies  Allergen Reactions  . Loratadine Other (See Comments)    Past Medical History:  Diagnosis Date  . Allergy   . GERD (gastroesophageal reflux disease)    Social  History   Socioeconomic History  . Marital status: Married    Spouse name: Not on file  . Number of children: Not on file  . Years of education: Not on file  . Highest education level: Not on file  Occupational History  . Not on file  Tobacco Use  . Smoking status: Never Smoker  . Smokeless tobacco: Never Used  Substance and Sexual Activity  . Alcohol use: No  . Drug use: No  . Sexual activity: Not on file  Other Topics Concern  . Not on file  Social History Narrative  . Not on file   Social Determinants of Health   Financial Resource Strain:   . Difficulty of Paying Living Expenses: Not on file  Food Insecurity:   . Worried About Charity fundraiser in the Last Year: Not on file  . Ran Out of Food in the Last Year: Not on file  Transportation Needs:   . Lack of Transportation (Medical): Not on file  . Lack of Transportation (Non-Medical): Not on file  Physical Activity:   . Days of Exercise per Week: Not on file  . Minutes of Exercise per Session: Not on file  Stress:   . Feeling of Stress : Not on file  Social Connections:   . Frequency of Communication with Friends and Family: Not on file  . Frequency of Social Gatherings with Friends and Family: Not on file  .  Attends Religious Services: Not on file  . Active Member of Clubs or Organizations: Not on file  . Attends Archivist Meetings: Not on file  . Marital Status: Not on file  Intimate Partner Violence:   . Fear of Current or Ex-Partner: Not on file  . Emotionally Abused: Not on file  . Physically Abused: Not on file  . Sexually Abused: Not on file   Family History  Problem Relation Age of Onset  . Colon cancer Maternal Aunt   . Stomach cancer Maternal Aunt   . Colon polyps Neg Hx   . Esophageal cancer Neg Hx   . Rectal cancer Neg Hx    Past Surgical History:  Procedure Laterality Date  . APPENDECTOMY  1983     Vanessa Kick, MD 02/08/20 [phone]

## 2022-12-28 ENCOUNTER — Encounter: Payer: Self-pay | Admitting: Gastroenterology
